# Patient Record
Sex: Female | Born: 1990 | Race: White | Hispanic: No | Marital: Single | Smoking: Never smoker
Health system: Southern US, Community
[De-identification: ages and names within clinical notes are randomized; demographics above are authoritative.]

## PROBLEM LIST (undated history)

## (undated) ENCOUNTER — Encounter

## (undated) ENCOUNTER — Encounter: Attending: Medical | Primary: Medical

## (undated) ENCOUNTER — Ambulatory Visit: Attending: Pharmacist | Primary: Pharmacist

## (undated) ENCOUNTER — Ambulatory Visit

## (undated) ENCOUNTER — Encounter: Attending: Pharmacist | Primary: Pharmacist

## (undated) ENCOUNTER — Ambulatory Visit: Attending: Medical | Primary: Medical

## (undated) ENCOUNTER — Other Ambulatory Visit: Attending: Registered" | Primary: Registered"

## (undated) ENCOUNTER — Encounter
Attending: Student in an Organized Health Care Education/Training Program | Primary: Student in an Organized Health Care Education/Training Program

## (undated) ENCOUNTER — Ambulatory Visit: Payer: PRIVATE HEALTH INSURANCE

## (undated) ENCOUNTER — Other Ambulatory Visit: Attending: Medical | Primary: Medical

## (undated) ENCOUNTER — Ambulatory Visit: Payer: PRIVATE HEALTH INSURANCE | Attending: Medical | Primary: Medical

## (undated) ENCOUNTER — Telehealth

## (undated) ENCOUNTER — Other Ambulatory Visit

## (undated) ENCOUNTER — Telehealth: Attending: Medical | Primary: Medical

## (undated) DIAGNOSIS — K859 Acute pancreatitis without necrosis or infection, unspecified: Secondary | ICD-10-CM

## (undated) DIAGNOSIS — J45909 Unspecified asthma, uncomplicated: Secondary | ICD-10-CM

## (undated) HISTORY — DX: Acute pancreatitis without necrosis or infection, unspecified: K85.90

## (undated) MED ORDER — CITRUCEL (SUCROSE) ORAL POWDER: 0.00000 days

---

## 2020-02-21 ENCOUNTER — Ambulatory Visit: Payer: Self-pay

## 2020-07-29 ENCOUNTER — Emergency Department (HOSPITAL_COMMUNITY): Payer: BC Managed Care – PPO

## 2020-07-29 ENCOUNTER — Inpatient Hospital Stay (HOSPITAL_COMMUNITY)
Admission: EM | Admit: 2020-07-29 | Discharge: 2020-07-31 | DRG: 440 | Disposition: A | Discharge status: Home / Self Care | Payer: BC Managed Care – PPO | Attending: Internal Medicine | Admitting: Internal Medicine

## 2020-07-29 ENCOUNTER — Encounter (HOSPITAL_COMMUNITY): Payer: Self-pay | Admitting: Emergency Medicine

## 2020-07-29 DIAGNOSIS — K859 Acute pancreatitis without necrosis or infection, unspecified: Secondary | ICD-10-CM | POA: Diagnosis present

## 2020-07-29 DIAGNOSIS — Z20822 Contact with and (suspected) exposure to covid-19: Secondary | ICD-10-CM | POA: Diagnosis present

## 2020-07-29 DIAGNOSIS — K858 Other acute pancreatitis without necrosis or infection: Secondary | ICD-10-CM | POA: Diagnosis present

## 2020-07-29 DIAGNOSIS — R109 Unspecified abdominal pain: Secondary | ICD-10-CM

## 2020-07-29 DIAGNOSIS — R1013 Epigastric pain: Secondary | ICD-10-CM

## 2020-07-29 LAB — COMPREHENSIVE METABOLIC PANEL
ALT: 22 U/L (ref 0–44)
AST: 36 U/L (ref 15–41)
Albumin: 4.5 g/dL (ref 3.5–5.0)
Alkaline Phosphatase: 47 U/L (ref 38–126)
Anion gap: 11 (ref 5–15)
BUN: 12 mg/dL (ref 6–20)
CO2: 23 mmol/L (ref 22–32)
Calcium: 9.5 mg/dL (ref 8.9–10.3)
Chloride: 99 mmol/L (ref 98–111)
Creatinine, Ser: 0.65 mg/dL (ref 0.44–1.00)
GFR calc Af Amer: >60 mL/min (ref >60)
GFR calc non Af Amer: >60 mL/min (ref >60)
Glucose, Bld: 123 mg/dL — ABNORMAL HIGH (ref 70–99)
Potassium: 5 mmol/L (ref 3.5–5.1)
Sodium: 133 mmol/L — ABNORMAL LOW (ref 135–145)
Total Bilirubin: 0.9 mg/dL (ref 0.3–1.2)
Total Protein: 8.4 g/dL — ABNORMAL HIGH (ref 6.5–8.1)

## 2020-07-29 LAB — URINALYSIS, ROUTINE W REFLEX MICROSCOPIC
Bilirubin Urine: NEGATIVE
Glucose, UA: NEGATIVE mg/dL
Hgb urine dipstick: NEGATIVE
Ketones, ur: 20 mg/dL — AB
Nitrite: NEGATIVE
Protein, ur: NEGATIVE mg/dL
Specific Gravity, Urine: 1.024 (ref 1.005–1.030)
pH: 5 (ref 5.0–8.0)

## 2020-07-29 LAB — CBC
HCT: 44 % (ref 36.0–46.0)
Hemoglobin: 14.4 g/dL (ref 12.0–15.0)
MCH: 29.9 pg (ref 26.0–34.0)
MCHC: 32.7 g/dL (ref 30.0–36.0)
MCV: 91.3 fL (ref 80.0–100.0)
Platelets: 337 10*3/uL (ref 150–400)
RBC: 4.82 MIL/uL (ref 3.87–5.11)
RDW: 12 % (ref 11.5–15.5)
WBC: 11.2 10*3/uL — ABNORMAL HIGH (ref 4.0–10.5)
nRBC: 0 % (ref 0.0–0.2)

## 2020-07-29 LAB — HCG, QUANTITATIVE, PREGNANCY: hCG, Beta Chain, Quant, S: <1 m[IU]/mL (ref <5)

## 2020-07-29 LAB — SARS CORONAVIRUS 2 BY RT PCR (HOSPITAL ORDER, PERFORMED IN ~~LOC~~ HOSPITAL LAB): SARS Coronavirus 2: NEGATIVE

## 2020-07-29 LAB — LIPASE, BLOOD: Lipase: 1958 U/L — ABNORMAL HIGH (ref 11–51)

## 2020-07-29 MED ORDER — ONDANSETRON HCL 4 MG/2ML IJ SOLN
4.0000 mg | Freq: Once | INTRAMUSCULAR | Status: AC
  Administered 2020-07-29: 4 mg via INTRAVENOUS
  Filled 2020-07-29: qty 2

## 2020-07-29 MED ORDER — IOHEXOL 300 MG/ML  SOLN
100.0000 mL | Freq: Once | INTRAMUSCULAR | Status: AC | PRN
  Administered 2020-07-29: 100 mL via INTRAVENOUS

## 2020-07-29 MED ORDER — FENTANYL CITRATE (PF) 100 MCG/2ML IJ SOLN
50.0000 ug | Freq: Once | INTRAMUSCULAR | Status: AC
  Administered 2020-07-29: 50 ug via INTRAVENOUS
  Filled 2020-07-29: qty 2

## 2020-07-29 MED ORDER — SODIUM CHLORIDE 0.9 % IV BOLUS
1000.0000 mL | Freq: Once | INTRAVENOUS | Status: AC
  Administered 2020-07-29: 1000 mL via INTRAVENOUS

## 2020-07-29 MED ORDER — SODIUM CHLORIDE (PF) 0.9 % IJ SOLN
INTRAMUSCULAR | Status: AC
  Administered 2020-07-29: 10 mL
  Filled 2020-07-29: qty 50

## 2020-07-29 MED ORDER — ONDANSETRON 4 MG PO TBDP
4.0000 mg | ORAL_TABLET | Freq: Once | ORAL | Status: AC | PRN
  Administered 2020-07-29: 4 mg via ORAL
  Filled 2020-07-29: qty 1

## 2020-07-29 MED ORDER — MORPHINE SULFATE (PF) 4 MG/ML IV SOLN
4.0000 mg | Freq: Once | INTRAVENOUS | Status: AC
  Administered 2020-07-29: 4 mg via INTRAVENOUS
  Filled 2020-07-29: qty 1

## 2020-07-29 MED ORDER — SODIUM CHLORIDE 0.9 % IV SOLN: Freq: Once | INTRAVENOUS | Status: AC

## 2020-07-29 NOTE — ED Provider Notes (Signed)
[facility]-EMERGENCY DEPT Provider Note   CSN: [id number] Arrival date & time: 07/29/20  1249     History Chief Complaint  Patient presents with  . Abdominal Pain  . Emesis    Angela Montes is a 29 y.o. female who presents for evaluation of nausea/vomiting and abdominal pain that began yesterday morning.  She reports that yesterday morning, she ate a big cinnamon roll.  She reports shortly after, she started having upper and middle abdominal pain.  She states that then she started having nausea/vomiting.  She initially attributed to eating a large amount of bread as she states she had had similar pain after eating a bagel previously.  She states that the pain persisted.  She has still had nausea/vomiting is not been able to tolerate any p.o.  She states that she has not taken anything for the pain.  She has not had any fever.  She denies any chest pain, difficulty breathing, dysuria, hematuria, diarrhea.  No prior history of abdominal surgeries.  Patient reports that she does not smoke.  She reports she socially drinks alcohol but reports very infrequently.  She just did return from a month-long camping trip.  She does not recall being bit by anything.  She recently started benzonatate but no other medications.   The history is provided by the patient.       History reviewed. No pertinent past medical history.  There are no problems to display for this patient.   History reviewed. No pertinent surgical history.   OB History   No obstetric history on file.     No family history on file.  Social History   Tobacco Use  . Smoking status: Not on file  Substance Use Topics  . Alcohol use: Not on file  . Drug use: Not on file    Home Medications Prior to Admission medications   Medication Sig Start Date End Date Taking? Authorizing Provider  albuterol (VENTOLIN HFA) 108 (90 Base) MCG/ACT inhaler Inhale 2 puffs into the lungs every 6 (six) hours as  needed for wheezing or shortness of breath.  07/26/20  Yes [provider]  benzonatate (TESSALON) 100 MG capsule Take 100 mg by mouth 3 (three) times daily. 07/26/20  Yes [provider]    Allergies    Patient has no known allergies.  Review of Systems   Review of Systems  Constitutional: Negative for fever.  Respiratory: Negative for cough and shortness of breath.   Cardiovascular: Negative for chest pain.  Gastrointestinal: Positive for abdominal pain, nausea and vomiting. Negative for diarrhea.  Genitourinary: Negative for dysuria and hematuria.  Neurological: Negative for headaches.  All other systems reviewed and are negative.   Physical Exam Updated Vital Signs BP 118/79   Pulse 61   Temp (!) 97.5 F (36.4 C)   Resp 18   Ht 5\' 6"  (1.676 m)   Wt 67.1 kg   LMP 07/15/2020 (Approximate)   SpO2 100%   BMI 23.89 kg/m   Physical Exam Vitals and nursing note reviewed.  Constitutional:      Appearance: Normal appearance. She is well-developed.  HENT:     Head: Normocephalic and atraumatic.  Eyes:     General: Lids are normal.     Conjunctiva/sclera: Conjunctivae normal.     Pupils: Pupils are equal, round, and reactive to light.  Cardiovascular:     Rate and Rhythm: Normal rate and regular rhythm.     Pulses: Normal pulses.  Heart sounds: Normal heart sounds. No murmur heard.  No friction rub. No gallop.   Pulmonary:     Effort: Pulmonary effort is normal.     Breath sounds: Normal breath sounds.     Comments: Lungs clear to auscultation bilaterally.  Symmetric chest rise.  No wheezing, rales, rhonchi. Abdominal:     Palpations: Abdomen is soft. Abdomen is not rigid.     Tenderness: There is abdominal tenderness in the right upper quadrant, epigastric area and periumbilical area. There is no right CVA tenderness, left CVA tenderness or guarding.     Comments: Tenderness palpation noted to the right upper quadrant, periumbilical, epigastric region.   No rigidity, guarding.  Positive Murphy sign.  No CVA tenderness noted bilaterally.  Musculoskeletal:        General: Normal range of motion.     Cervical back: Full passive range of motion without pain.  Skin:    General: Skin is warm and dry.     Capillary Refill: Capillary refill takes less than 2 seconds.  Neurological:     Mental Status: She is alert and oriented to person, place, and time.  Psychiatric:        Speech: Speech normal.     ED Results / Procedures / Treatments   Labs (all labs ordered are listed, but only abnormal results are displayed) Labs Reviewed  LIPASE, BLOOD - Abnormal; Notable for the following components:      Result Value   Lipase 1,958 (*)    All other components within normal limits  COMPREHENSIVE METABOLIC PANEL - Abnormal; Notable for the following components:   Sodium 133 (*)    Glucose, Bld 123 (*)    Total Protein 8.4 (*)    All other components within normal limits  URINALYSIS, ROUTINE W REFLEX MICROSCOPIC - Abnormal; Notable for the following components:   Ketones, ur 20 (*)    Leukocytes,Ua TRACE (*)    Bacteria, UA FEW (*)    All other components within normal limits  CBC - Abnormal; Notable for the following components:   WBC 11.2 (*)    All other components within normal limits  SARS CORONAVIRUS 2 BY RT PCR (HOSPITAL ORDER, PERFORMED IN [facility])  HCG, QUANTITATIVE, PREGNANCY  TRIGLYCERIDES    EKG None  Radiology CT ABDOMEN PELVIS W CONTRAST  Result Date: 07/29/2020 CLINICAL DATA:  Generalized abdominal pain with nausea and vomiting. EXAM: CT ABDOMEN AND PELVIS WITH CONTRAST TECHNIQUE: Multidetector CT imaging of the abdomen and pelvis was performed using the standard protocol following bolus administration of intravenous contrast. CONTRAST:  OMNIPAQUE IOHEXOL 300 MG/ML  SOLN COMPARISON:  Ultrasound 07/29/2020 FINDINGS: Lower chest: The lung bases are clear. Hepatobiliary: No focal liver abnormality is seen.  No gallstones, gallbladder wall thickening, or biliary dilatation. Pancreas: Diffuse enlargement of the pancreas. Peripancreatic edema. Mild stranding around the pancreas. Changes consistent with acute pancreatitis. No localized fluid collections and no ductal dilatation. Spleen: Normal in size without focal abnormality. Adrenals/Urinary Tract: Adrenal glands are unremarkable. Kidneys are normal, without renal calculi, focal lesion, or hydronephrosis. Bladder is unremarkable. Stomach/Bowel: Stomach is within normal limits. Appendix appears normal. No evidence of bowel wall thickening, distention, or inflammatory changes. Vascular/Lymphatic: No significant vascular findings are present. No enlarged abdominal or pelvic lymph nodes. Reproductive: Uterus and bilateral adnexa are unremarkable. Other: Small amount of free fluid in the pelvis. This could be physiologic or reactive related to the pancreatitis. Musculoskeletal: No acute or significant osseous findings. IMPRESSION:  1. Changes of acute interstitial edematous pancreatitis. No localized fluid collections and no ductal dilatation. 2. Small amount of free fluid in the pelvis could be physiologic or reactive related to the pancreatitis. Electronically Signed   By: Burman Nieves M.D.   On: 07/29/2020 22:15   US Abdomen Limited RUQ  Result Date: 07/29/2020 CLINICAL DATA:  Abdomen pain EXAM: ULTRASOUND ABDOMEN LIMITED RIGHT UPPER QUADRANT COMPARISON:  None. FINDINGS: Gallbladder: No gallstones or wall thickening visualized. No sonographic Murphy sign noted by sonographer. Common bile duct: Diameter: 3 mm Liver: No focal lesion identified. Within normal limits in parenchymal echogenicity. Portal vein is patent on color Doppler imaging with normal direction of blood flow towards the liver. Other: The pancreas appears enlarged. IMPRESSION: 1. Negative for gallstones. 2. Pancreas appears enlarged, raising possibility of pancreatitis. Correlation with CT may be  obtained. Electronically Signed   By: Jasmine Pang M.D.   On: 07/29/2020 21:37    Procedures Procedures (including critical care time)  Medications Ordered in ED Medications  ondansetron (ZOFRAN-ODT) disintegrating tablet 4 mg (4 mg Oral Given 07/29/20 1650)  sodium chloride 0.9 % bolus 1,000 mL (0 mLs Intravenous Stopped 07/29/20 2215)  morphine 4 MG/ML injection 4 mg (4 mg Intravenous Given 07/29/20 2048)  ondansetron (ZOFRAN) injection 4 mg (4 mg Intravenous Given 07/29/20 2048)  sodium chloride (PF) 0.9 % injection (10 mLs  Given 07/29/20 2054)  iohexol (OMNIPAQUE) 300 MG/ML solution 100 mL (100 mLs Intravenous Contrast Given 07/29/20 2155)  fentaNYL (SUBLIMAZE) injection 50 mcg (50 mcg Intravenous Given 07/29/20 2205)  ondansetron (ZOFRAN) injection 4 mg (4 mg Intravenous Given 07/29/20 2204)  0.9 %  sodium chloride infusion ( Intravenous New Bag/Given 07/29/20 2217)    ED Course  I have reviewed the triage vital signs and the nursing notes.  Pertinent labs & imaging results that were available during my care of the patient were reviewed by me and considered in my medical decision making (see chart for details).  Clinical Course as of Jul 30 2255  Thu Jul 29, 2020  2159 Lipase(!): 1,958 [AS]  2200 Lipase(!): 1,958 [AS]    Clinical Course User Index [AS] Ardith Dark   MDM Rules/Calculators/A&P                          29 year old who presents for evaluation abdominal pain, nausea/vomiting.  No fevers.  No recent alcohol use.  Recently on Tessalon Perles for cough.  No other medications.  No frequent alcohol use.  On initial arrival, she is afebrile, nontoxic-appearing.  Vital signs are stable.  On exam, she has tenderness palpation noted to the mid, right upper quadrant abdomen.  No CVA tenderness.  Concern for infectious etiology versus hepatobiliary etiology.  I-STAT beta negative.  CBC shows leukocytosis of 11.2.  Beta quant is negative.  UA shows trace leukocytes, pyuria.   CMP shows normal LFTs.  Lipase is significantly elevated at 1,958. Will obtain RUQ U/S. She does not have any risk factors for pancreatitis so question if this is gallstone induced pancreatitis.   Ultrasound negative for any gallstones.  We will plan for CT scan.  CT scan shows changes noted with acute interstitial edematous pancreatitis.  No "localized fluid collections.  No ductal dilatation.  Discussed results with patient.  Given patient's high lipase level and concern for pancreatitis, will plan to admit her for pain control, fluids.  Updated patient on plan she is agreeable. Discussed patient with Dr. Anitra Lauth  who is agreeable.   Discussed patient with Dr. Vennie Homans (GI) who will accept patient for admission. Requests that triglycerides be sent.   Updated patient on plan and she is agreeable.   Portions of this note were generated with Scientist, clinical (histocompatibility and immunogenetics). Dictation errors may occur despite best attempts at proofreading.   Final Clinical Impression(s) / ED Diagnoses Final diagnoses:  Abdominal pain  Acute pancreatitis, unspecified complication status, unspecified pancreatitis type    Rx / DC Orders ED Discharge Orders    None       Rosana Hoes 07/29/20 2256    Gwyneth Sprout, MD 07/30/20 1552

## 2020-07-29 NOTE — ED Triage Notes (Signed)
Patient c/o abdominal pain since waking this morning with N/V. Denies diarrhea.

## 2020-07-29 NOTE — H&P (Signed)
[facility] History and Physical  Angela Montes WIO:[id number] DOB: 08/16/1991 DOA: 07/29/2020  Referring physician: Graciella Freer, PA-C PCP: Patient, No Pcp Per   Chief Complaint: abdominal pain  HPI: Angela Montes is a 29 y.o. female past medical history notable only for asthma who presents with a 1 day history of abdominal pain.  Patient reports she had some bread this morning and afterwards developed abdominal pain in the epigastric area. She has had similar symptoms before when eating bread but this pain did not go away and in fact got worse. Over the course the day she also developed nausea and vomiting, it was nonbloody and nonbilious. She denies any fevers, she reports a BM earlier today that was nonbloody.  Patient endorses occasional and very small amounts of alcohol intake but none recently. For the past several weeks she was on a cross-country road trip with a school group that was unremarkable, she did develop a hoarse voice and recently saw her PCP who prescribed Tessalon Perles for her. Otherwise she denies any other new medications. Did not believe she received any other bug bites besides mosquitoes while on the trip.  Mother present at bedside, she does not know of any problems with cholesterol or triglycerides in the family.  In the ED vital signs of been unremarkable, laboratory work-up notable for unremarkable CBC, unremarkable CMP, and lipase of 1958. She underwent abdominal ultrasound which showed findings concerning for pancreatitis but no evidence of gallstones, and subsequently a CT abdomen pelvis which also showed acute pancreatitis. She was given 1 L normal saline, IV pain meds and antiemetics, and admitted for further management.    Review of Systems:   History reviewed. No pertinent past medical history. History reviewed. No pertinent surgical history. Social History:  has no history on file for tobacco use, alcohol use, and drug use.  No  Known Allergies  No family history on file.   Prior to Admission medications   Medication Sig Start Date End Date Taking? Authorizing Provider  albuterol (VENTOLIN HFA) 108 (90 Base) MCG/ACT inhaler Inhale 2 puffs into the lungs every 6 (six) hours as needed for wheezing or shortness of breath.  07/26/20  Yes [provider]  benzonatate (TESSALON) 100 MG capsule Take 100 mg by mouth 3 (three) times daily. 07/26/20  Yes [provider]   Physical Exam: Vitals:   07/29/20 2100 07/29/20 2131 07/29/20 2210 07/29/20 2247  BP: 120/77 120/76 124/81 118/79  Pulse: (!) 57 (!) 49 66 61  Resp: 18 18 18 18   Temp:      TempSrc:      SpO2: 100% 100% 100% 100%  Weight:      Height:        Wt Readings from Last 3 Encounters:  07/29/20 67.1 kg    General:  Appears calm and comfortable Eyes: PERRL, normal lids, irises & conjunctiva. ?very small xanthelasmas? bilaterally ENT: grossly normal hearing, lips & tongue Neck: no LAD, masses or thyromegaly Cardiovascular: RRR, no m/r/g. No LE edema. Respiratory: CTA bilaterally, no w/r/r. Normal respiratory effort. Abdomen: soft, mild epigastric tenderness to palpation Musculoskeletal: grossly normal tone BUE/BLE Psychiatric: grossly normal mood and affect, speech fluent and appropriate Neurologic: grossly non-focal.          Labs on Admission:  Basic Metabolic Panel: Recent Labs  Lab 07/29/20 1441  NA 133*  K 5.0  CL 99  CO2 23  GLUCOSE 123*  BUN 12  CREATININE 0.65  CALCIUM 9.5  Liver Function Tests: Recent Labs  Lab 07/29/20 1441  AST 36  ALT 22  ALKPHOS 47  BILITOT 0.9  PROT 8.4*  ALBUMIN 4.5   Recent Labs  Lab 07/29/20 1441  LIPASE 1,958*   No results for input(s): AMMONIA in the last 168 hours. CBC: Recent Labs  Lab 07/29/20 1657  WBC 11.2*  HGB 14.4  HCT 44.0  MCV 91.3  PLT 337   Cardiac Enzymes: No results for input(s): CKTOTAL, CKMB, CKMBINDEX, TROPONINI in the last 168 hours.  BNP  (last 3 results) No results for input(s): BNP in the last 8760 hours.  ProBNP (last 3 results) No results for input(s): PROBNP in the last 8760 hours.  CBG: No results for input(s): GLUCAP in the last 168 hours.  Radiological Exams on Admission: CT ABDOMEN PELVIS W CONTRAST  Result Date: 07/29/2020 CLINICAL DATA:  Generalized abdominal pain with nausea and vomiting. EXAM: CT ABDOMEN AND PELVIS WITH CONTRAST TECHNIQUE: Multidetector CT imaging of the abdomen and pelvis was performed using the standard protocol following bolus administration of intravenous contrast. CONTRAST:  OMNIPAQUE IOHEXOL 300 MG/ML  SOLN COMPARISON:  Ultrasound 07/29/2020 FINDINGS: Lower chest: The lung bases are clear. Hepatobiliary: No focal liver abnormality is seen. No gallstones, gallbladder wall thickening, or biliary dilatation. Pancreas: Diffuse enlargement of the pancreas. Peripancreatic edema. Mild stranding around the pancreas. Changes consistent with acute pancreatitis. No localized fluid collections and no ductal dilatation. Spleen: Normal in size without focal abnormality. Adrenals/Urinary Tract: Adrenal glands are unremarkable. Kidneys are normal, without renal calculi, focal lesion, or hydronephrosis. Bladder is unremarkable. Stomach/Bowel: Stomach is within normal limits. Appendix appears normal. No evidence of bowel wall thickening, distention, or inflammatory changes. Vascular/Lymphatic: No significant vascular findings are present. No enlarged abdominal or pelvic lymph nodes. Reproductive: Uterus and bilateral adnexa are unremarkable. Other: Small amount of free fluid in the pelvis. This could be physiologic or reactive related to the pancreatitis. Musculoskeletal: No acute or significant osseous findings. IMPRESSION: 1. Changes of acute interstitial edematous pancreatitis. No localized fluid collections and no ductal dilatation. 2. Small amount of free fluid in the pelvis could be physiologic or reactive  related to the pancreatitis. Electronically Signed   By: Burman Nieves M.D.   On: 07/29/2020 22:15   US Abdomen Limited RUQ  Result Date: 07/29/2020 CLINICAL DATA:  Abdomen pain EXAM: ULTRASOUND ABDOMEN LIMITED RIGHT UPPER QUADRANT COMPARISON:  None. FINDINGS: Gallbladder: No gallstones or wall thickening visualized. No sonographic Murphy sign noted by sonographer. Common bile duct: Diameter: 3 mm Liver: No focal lesion identified. Within normal limits in parenchymal echogenicity. Portal vein is patent on color Doppler imaging with normal direction of blood flow towards the liver. Other: The pancreas appears enlarged. IMPRESSION: 1. Negative for gallstones. 2. Pancreas appears enlarged, raising possibility of pancreatitis. Correlation with CT may be obtained. Electronically Signed   By: Jasmine Pang M.D.   On: 07/29/2020 21:37    EKG: Not obtained  Assessment/Plan Active Problems:   Acute pancreatitis  #Acute pancreatitis Patient presenting with laboratory and imaging findings consistent with acute pancreatitis, at present of unclear etiology. No evidence of gallstones, patient denies excess alcohol intake, calcium levels are normal, and no new medications are likely culprits. On exam some question of xanthelasma around the eyes, and given lack of other precipitating factors suspect hypertriglyceridemia may be contributing, labs currently pending. If does return with hypertriglyceridemia will need to be transferred to stepdown unit for insulin drip, for now will admit to MedSurg. Discussed diagnosis,  work-up, and treatment at length with patient and her mother at bedside. -LR at 150 cc an hour -N.p.o., advance diet as tolerated -IV pain meds and antiemetics as needed -Follow-up triglycerides   Code Status: Full Code, unconfirmed DVT Prophylaxis: early ambulation Family Communication: mother at bedside during admission Disposition Plan: Inpatient  Time spent: 13 min  Venora Maples  MD/MPH [facility]

## 2020-07-30 ENCOUNTER — Encounter (HOSPITAL_COMMUNITY): Payer: Self-pay | Admitting: Family Medicine

## 2020-07-30 ENCOUNTER — Other Ambulatory Visit: Payer: Self-pay

## 2020-07-30 DIAGNOSIS — K859 Acute pancreatitis without necrosis or infection, unspecified: Secondary | ICD-10-CM

## 2020-07-30 LAB — CBC
HCT: 41.7 % (ref 36.0–46.0)
Hemoglobin: 13.9 g/dL (ref 12.0–15.0)
MCH: 30.2 pg (ref 26.0–34.0)
MCHC: 33.3 g/dL (ref 30.0–36.0)
MCV: 90.5 fL (ref 80.0–100.0)
Platelets: 268 10*3/uL (ref 150–400)
RBC: 4.61 MIL/uL (ref 3.87–5.11)
RDW: 12.1 % (ref 11.5–15.5)
WBC: 10.4 10*3/uL (ref 4.0–10.5)
nRBC: 0 % (ref 0.0–0.2)

## 2020-07-30 LAB — TRIGLYCERIDES: Triglycerides: 40 mg/dL (ref <150)

## 2020-07-30 LAB — COMPREHENSIVE METABOLIC PANEL
ALT: 23 U/L (ref 0–44)
AST: 23 U/L (ref 15–41)
Albumin: 3.9 g/dL (ref 3.5–5.0)
Alkaline Phosphatase: 42 U/L (ref 38–126)
Anion gap: 11 (ref 5–15)
BUN: 11 mg/dL (ref 6–20)
CO2: 22 mmol/L (ref 22–32)
Calcium: 8.6 mg/dL — ABNORMAL LOW (ref 8.9–10.3)
Chloride: 102 mmol/L (ref 98–111)
Creatinine, Ser: 0.57 mg/dL (ref 0.44–1.00)
GFR calc Af Amer: >60 mL/min (ref >60)
GFR calc non Af Amer: >60 mL/min (ref >60)
Glucose, Bld: 112 mg/dL — ABNORMAL HIGH (ref 70–99)
Potassium: 4.2 mmol/L (ref 3.5–5.1)
Sodium: 135 mmol/L (ref 135–145)
Total Bilirubin: 0.7 mg/dL (ref 0.3–1.2)
Total Protein: 7 g/dL (ref 6.5–8.1)

## 2020-07-30 LAB — HIV ANTIBODY (ROUTINE TESTING W REFLEX): HIV Screen 4th Generation wRfx: NONREACTIVE

## 2020-07-30 MED ORDER — ALBUTEROL SULFATE HFA 108 (90 BASE) MCG/ACT IN AERS: 2.0000 | INHALATION_SPRAY | Freq: Four times a day (QID) | RESPIRATORY_TRACT | Status: DC | PRN

## 2020-07-30 MED ORDER — ONDANSETRON HCL 4 MG PO TABS: 4.0000 mg | ORAL_TABLET | Freq: Four times a day (QID) | ORAL | Status: DC | PRN

## 2020-07-30 MED ORDER — SODIUM CHLORIDE 0.9% FLUSH
3.0000 mL | Freq: Two times a day (BID) | INTRAVENOUS | Status: DC
  Administered 2020-07-30 (×2): 3 mL via INTRAVENOUS

## 2020-07-30 MED ORDER — HYDROMORPHONE HCL 1 MG/ML IJ SOLN
0.5000 mg | INTRAMUSCULAR | Status: DC | PRN
  Administered 2020-07-30: 13:00:00 1 mg via INTRAVENOUS
  Administered 2020-07-30: 0.5 mg via INTRAVENOUS
  Administered 2020-07-30 – 2020-07-31 (×6): 1 mg via INTRAVENOUS
  Filled 2020-07-30 (×9): qty 1

## 2020-07-30 MED ORDER — ALBUTEROL SULFATE (2.5 MG/3ML) 0.083% IN NEBU: 2.5000 mg | INHALATION_SOLUTION | Freq: Four times a day (QID) | RESPIRATORY_TRACT | Status: DC | PRN

## 2020-07-30 MED ORDER — POLYETHYLENE GLYCOL 3350 17 G PO PACK: 17.0000 g | PACK | Freq: Every day | ORAL | Status: DC | PRN

## 2020-07-30 MED ORDER — ONDANSETRON HCL 4 MG/2ML IJ SOLN: 4.0000 mg | Freq: Four times a day (QID) | INTRAMUSCULAR | Status: DC | PRN

## 2020-07-30 MED ORDER — LACTATED RINGERS IV SOLN: INTRAVENOUS | Status: DC

## 2020-07-30 NOTE — Consult Note (Addendum)
Referring Provider: Dr. Debbe Odea  Primary Care Physician:  Patient, No Pcp Per Primary Gastroenterologist:  Althia Forts   Reason for Consultation:  Acute pancreatitis   HPI: Angela Montes is a 29 y.o. female with a past medical history of asthma initially diagnosed at the age of 31.  No past surgical history.  She ate a cinnamon roll for dinner on Wed 07/28/2020. The next morning, she described feeling "off" but went to work. She developed nausea and central upper abdominal pain so she went home.  Her nausea and central abdominal pain worsened and she vomited clear water with mucous emesis x3 episodes.  No associated diarrhea.  She called her mother who advised her to go to the emergency room. She presented to [facility] emergency room on 07/29/2020 for further evaluation.  Labs in the ED showed a WBC 11.2.  Lipase level 1,958.  LFTs were normal. An abdominal ultrasound showed an enlarged pancreas without evidence of gallstones.  Abdominal/pelvic CT with contrast identified acute interstitial edematous pancreatitis without localized fluid collections or ductal dilatation.  Liver and gallbladder were normal. He nausea and abdominal pain were controlled after receiving Zofran and Morphine. She received NS 1L IV. A GI consult was requested for further evaluation.   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 She returned to [facility] on 07/25/2020. During her time of travel she developed a productive cough consisting of yellow mucous. No fever. No hemoptysis. She took Amoxicillin 575m one tab po QD x 3 days which she received from a friend who attended the camping trip. Her cough did not improve. Her dietary and sleeping habits were altered during her time of travel, ate more sandwiches and she slept less. No N/V or abdominal pain during her time of travel. She drank one margarita on Sunday 8/1 and a few days prior to that she drank 1/2 seltzer x 2. No  history of alcohol abuse or binge alcohol drinking. No drug use. No history of hypertriglyceridemia. She reported having a somewhat similar episode of central upper abdominal pain without N/V about 4 months ago which occurred after eating a bagel. She is passing a normal brown formed BM daily. No diarrhea. No oily stools or floating stools. No weight loss. No family history of pancreatitis. Her paternal uncle died at the age of 2 due to cystic fibrosis. She continue to have a productive cough. She contacted her PCP and she was prescribed an albuterol inhaler and Tessalon Perles. She took 1 Tessalon Perle on 8/ 3, 3 perles on 8/4 and one on 8/5. No other new medications. No other complaints today. Her mother is present.    ED course: Sodium 133.  Potassium 5.0.  Chloride 99.  CO2 23.  Glucose 123.  BUN 12.  Creatinine 0.65.  Alk phos 47.  Albumin 4.5.  Lipase 1958.  AST 36.  ALT 22.  Total bili 0.9.  WBC 11.2.  Hemoglobin 14.4.  Hematocrit 44.0.  MCV 91.3.  Platelet 337.  Beta hCG negative.  Urine ketones 20.  Urine leukocytes trace.  SARS coronavirus 2 negative.   Labs 07/30/2020: AST 23.  ALT 23.  Triglyceride 40.  WBC 10.4.  Hemoglobin 13.9.  Abdominal ultrasound 07/29/2020: 1. Negative for gallstones. 2. Pancreas appears enlarged, raising possibility of pancreatitis. Correlation with CT may be obtained.  Abdominal/pelvic CT with contrast 07/29/2020: 1. Changes of acute interstitial edematous pancreatitis. No localized fluid collections and no ductal dilatation. 2.  Small amount of free fluid in the pelvis could be physiologic or reactive related to the pancreatitis.    Prior to Admission medications   Medication Sig Start Date End Date Taking? Authorizing Provider  albuterol (VENTOLIN HFA) 108 (90 Base) MCG/ACT inhaler Inhale 2 puffs into the lungs every 6 (six) hours as needed for wheezing or shortness of breath.  07/26/20  Yes [provider]  benzonatate (TESSALON) 100 MG capsule Take  100 mg by mouth 3 (three) times daily. 07/26/20  Yes [provider]    Current Facility-Administered Medications  Medication Dose Route Frequency Provider Last Rate Last Admin  . albuterol (PROVENTIL) (2.5 MG/3ML) 0.083% nebulizer solution 2.5 mg  2.5 mg Nebulization Q6H PRN Clarnce Flock, MD      . HYDROmorphone (DILAUDID) injection 0.5-1 mg  0.5-1 mg Intravenous Q2H PRN Clarnce Flock, MD   1 mg at 07/30/20 0241  . lactated ringers infusion   Intravenous Continuous Clarnce Flock, MD 150 mL/hr at 07/30/20 0733 New Bag at 07/30/20 0733  . ondansetron (ZOFRAN) tablet 4 mg  4 mg Oral Q6H PRN Clarnce Flock, MD       Or  . ondansetron [facility]) injection 4 mg  4 mg Intravenous Q6H PRN Clarnce Flock, MD      . polyethylene glycol (MIRALAX / GLYCOLAX) packet 17 g  17 g Oral Daily PRN Clarnce Flock, MD      . sodium chloride flush (NS) 0.9 % injection 3 mL  3 mL Intravenous Q12H Clarnce Flock, MD   3 mL at 07/30/20 0021    Allergies as of 07/29/2020  . (No Known Allergies)    History reviewed. No pertinent family history.  Social History   Socioeconomic History  . Marital status: Unknown    Spouse name: Not on file  . Number of children: Not on file  . Years of education: Not on file  . Highest education level: Not on file  Occupational History  . Not on file  Tobacco Use  . Smoking status: Never Smoker  . Smokeless tobacco: Never Used  Substance and Sexual Activity  . Alcohol use: Not on file  . Drug use: Not on file  . Sexual activity: Not on file  Other Topics Concern  . Not on file  Social History Narrative  . Not on file   Social Determinants of Health   Financial Resource Strain:   . Difficulty of Paying Living Expenses:   Food Insecurity:   . Worried About Charity fundraiser in the Last Year:   . Arboriculturist in the Last Year:   Transportation Needs:   . Film/video editor (Medical):   Marland Kitchen Lack of Transportation  (Non-Medical):   Physical Activity:   . Days of Exercise per Week:   . Minutes of Exercise per Session:   Stress:   . Feeling of Stress :   Social Connections:   . Frequency of Communication with Friends and Family:   . Frequency of Social Gatherings with Friends and Family:   . Attends Religious Services:   . Active Member of Clubs or Organizations:   . Attends Archivist Meetings:   Marland Kitchen Marital Status:   Intimate Partner Violence:   . Fear of Current or Ex-Partner:   . Emotionally Abused:   Marland Kitchen Physically Abused:   . Sexually Abused:     Review of Systems: Gen: Denies fever, sweats or chills. No weight loss.  CV: Denies chest pain, palpitations or edema. Resp: Denies cough, shortness of breath of hemoptysis.  GI: See HPI.  GU : Denies urinary burning, blood in urine, increased urinary frequency or incontinence. MS: Denies joint pain, muscles aches or weakness. Derm: Denies rash, itchiness, skin lesions or unhealing ulcers. Psych: Denies depression, anxiety or memory loss.  Heme: Denies easy bruising, bleeding. Neuro:  Denies headaches, dizziness or paresthesias. Endo:  Denies any problems with DM, thyroid or adrenal function.  Physical Exam: Vital signs in last 24 hours: Temp:  [97.5 F (36.4 C)-98.1 F (36.7 C)] 98 F (36.7 C) (08/06 0513) Pulse Rate:  [49-77] 57 (08/06 0513) Resp:  [16-20] 16 (08/06 0513) BP: (106-127)/(70-94) 106/70 (08/06 0513) SpO2:  [98 %-100 %] 100 % (08/06 0513) Weight:  [67.1 kg-68 kg] 67.1 kg (08/05 1722) Last BM Date: 07/27/20 General:  Alert,  well-developed, well-nourished, pleasant and cooperative in NAD. Head:  Normocephalic and atraumatic. Eyes:  No scleral icterus. Conjunctiva pink. Ears:  Normal auditory acuity. Nose:  No deformity, discharge or lesions. Mouth:  Dentition intact. No ulcers or lesions.  Neck:  Supple. No lymphadenopathy or thyromegaly.  Lungs: Breath sounds clear throughout.  Heart:  RRR, no murmur.    Abdomen: Soft, nondistended.  Mild epigastric and right upper quadrant tenderness.  No left upper quadrant tenderness.  No rebound or guarding.  Positive bowel sounds to all 4 quadrants.  No mass.  No HSM. Rectal: Deferred. Musculoskeletal:  Symmetrical without gross deformities.  Pulses:  Normal pulses noted. Extremities:  Without clubbing or edema. Neurologic:  Alert and  oriented x4. No focal deficits.  Skin:  Intact without significant lesions or rashes. Psych:  Alert and cooperative. Normal mood and affect.  Intake/Output from previous day: 08/05 0701 - 08/06 0700 In: 247.5 [I.V.:247.5] Out: 700 [Urine:700] Intake/Output this shift: Total I/O In: 827.5 [I.V.:827.5] Out: -   Lab Results: Recent Labs    07/29/20 1657 07/30/20 0214  WBC 11.2* 10.4  HGB 14.4 13.9  HCT 44.0 41.7  PLT 337 268   BMET Recent Labs    07/29/20 1441 07/30/20 0214  NA 133* 135  K 5.0 4.2  CL 99 102  CO2 23 22  GLUCOSE 123* 112*  BUN 12 11  CREATININE 0.65 0.57  CALCIUM 9.5 8.6*   LFT Recent Labs    07/30/20 0214  PROT 7.0  ALBUMIN 3.9  AST 23  ALT 23  ALKPHOS 42  BILITOT 0.7   PT/INR No results for input(s): LABPROT, INR in the last 72 hours. Hepatitis Panel No results for input(s): HEPBSAG, HCVAB, HEPAIGM, HEPBIGM in the last 72 hours.    Studies/Results: CT ABDOMEN PELVIS W CONTRAST  Result Date: 07/29/2020 CLINICAL DATA:  Generalized abdominal pain with nausea and vomiting. EXAM: CT ABDOMEN AND PELVIS WITH CONTRAST TECHNIQUE: Multidetector CT imaging of the abdomen and pelvis was performed using the standard protocol following bolus administration of intravenous contrast. CONTRAST:  144m OMNIPAQUE IOHEXOL 300 MG/ML  SOLN COMPARISON:  Ultrasound 07/29/2020 FINDINGS: Lower chest: The lung bases are clear. Hepatobiliary: No focal liver abnormality is seen. No gallstones, gallbladder wall thickening, or biliary dilatation. Pancreas: Diffuse enlargement of the pancreas.  Peripancreatic edema. Mild stranding around the pancreas. Changes consistent with acute pancreatitis. No localized fluid collections and no ductal dilatation. Spleen: Normal in size without focal abnormality. Adrenals/Urinary Tract: Adrenal glands are unremarkable. Kidneys are normal, without renal calculi, focal lesion, or hydronephrosis. Bladder is unremarkable. Stomach/Bowel: Stomach is within normal limits. Appendix appears normal.  No evidence of bowel wall thickening, distention, or inflammatory changes. Vascular/Lymphatic: No significant vascular findings are present. No enlarged abdominal or pelvic lymph nodes. Reproductive: Uterus and bilateral adnexa are unremarkable. Other: Small amount of free fluid in the pelvis. This could be physiologic or reactive related to the pancreatitis. Musculoskeletal: No acute or significant osseous findings. IMPRESSION: 1. Changes of acute interstitial edematous pancreatitis. No localized fluid collections and no ductal dilatation. 2. Small amount of free fluid in the pelvis could be physiologic or reactive related to the pancreatitis. Electronically Signed   By: Lucienne Capers M.D.   On: 07/29/2020 22:15   US Abdomen Limited RUQ  Result Date: 07/29/2020 CLINICAL DATA:  Abdomen pain EXAM: ULTRASOUND ABDOMEN LIMITED RIGHT UPPER QUADRANT COMPARISON:  None. FINDINGS: Gallbladder: No gallstones or wall thickening visualized. No sonographic Murphy sign noted by sonographer. Common bile duct: Diameter: 3 mm Liver: No focal lesion identified. Within normal limits in parenchymal echogenicity. Portal vein is patent on color Doppler imaging with normal direction of blood flow towards the liver. Other: The pancreas appears enlarged. IMPRESSION: 1. Negative for gallstones. 2. Pancreas appears enlarged, raising possibility of pancreatitis. Correlation with CT may be obtained. Electronically Signed   By: Donavan Foil M.D.   On: 07/29/2020 21:37    IMPRESSION/PLAN:  77.   29 year old female with nausea/vomiting and upper abdominal pain consistent with acute pancreatitis.  Admission lipase 1,958.  Normal LFTs.  Triglycerides 40.  Right upper quadrant ultrasound showed an enlarged pancreas. CTAP identified acute interstitial edematous pancreatitis. -Continue aggressive IV hydration.  LR at 150 cc an hour.  -IgG4 level, repeat Lipase level -Consider checking CFTR gene (+ family history of cystic fibrosis) -Consider abdominal MRI/MRCP as an outpatient to follow-up on pancreatitis, closer evaluation of the pancreatic ducts and to rule out pancreatic divisum -Okay to start clear liquid  -Pain management per the hospitalist -Zofran as needed -She will require a follow-up with Dr. Ardis Hughs in 6 to 8 weeks once discharged from the hospital   2.  History of asthma with current productive cough. Family history of CF.  -Management per the hospitalist and PCP     Noralyn Pick  07/30/2020, 8:52 AM  ________________________________________________________________________  Velora Heckler GI MD note:  I personally examined the patient, reviewed the data and agree with the assessment and plan described above.  Unclear etiology of her rather mild acute pancreatitis. We are checking for AIP by checking IgG 4 level. Also sending CFTR gene testing given FH of CF.  She has had viral URI symptoms and so perhaps that virus irritated her pancreas as well as her upper respiratory tract?  Ok to start clears, she knows she needs to ambulate, IV fluids, pain control.  Hopefully home in next 1-2 days.   Owens Loffler, MD [facility] Gastroenterology Pager [phone]

## 2020-07-30 NOTE — Progress Notes (Signed)
PROGRESS NOTE    Angela Montes   QQI:[id number]  DOB: August 11, 1991  DOA: 07/29/2020 PCP: Patient, No Pcp Per   Brief Narrative:  Angela Montes is a 29 y.o. female past medical history notable only for asthma who presents with a 1 day history of abdominal pain. She is found to have acute pancreatitis in the ED per imaging and blood work. She does not drink alcohol heavily.  Lipase 1,958 CT abd and Ultrasound abdomen both consistent with acute pancreatitis.   She admits to having a cough for a few weeks for which she took amoxicillin. She recently as prescribedTessalon Perles and an albuterol inhaler prescribed for this. Her maternal aunt has MS and her maternal uncle had transverse myelitis in his 30s and became paralyzed. Also a h/o cystic fibrosis in a paternal uncle.  No recent tick bites.   Subjective: Pain in central and right abdomen improving. Vomiting resolved. Would like to drink.     Assessment & Plan:   Active Problems:   Acute pancreatitis - etiology undetermined- not an alcoholic, normal triglycerides and no gallstontes - cont LR at 150 cc/hr, start clears - etiology ? Viral infection, ? Autoimmune, ? Cystic fibrosis- I have asked for a GI consult to help determine the etiology of this- we have ordered Ig G and Cystic Fibrosis gene mutation testing  Time spent in minutes: 35 DVT prophylaxis:  SCDs Code Status: Full code Family Communication: mother  Disposition Plan:  Status is: Inpatient  Remains inpatient appropriate because:acute pancreatitis on IV treatments   Dispo: The patient is from: Home              Anticipated d/c is to: Home              Anticipated d/c date is: 1 day possibly home tomorrow if stable              Patient currently is not medically stable to d/c.      Consultants:   GI Procedures:   none Antimicrobials:  Anti-infectives (From admission, onward)   None       Objective: Vitals:   07/30/20 0036 07/30/20  0106 07/30/20 0135 07/30/20 0513  BP: 113/70 118/75 (!) 123/94 106/70  Pulse: (!) 56 (!) 57 77 (!) 57  Resp: 18 18 17 16   Temp:   98.1 F (36.7 C) 98 F (36.7 C)  TempSrc:   Oral Oral  SpO2: 98% 98% 100% 100%  Weight:      Height:        Intake/Output Summary (Last 24 hours) at 07/30/2020 0921 Last data filed at 07/30/2020 0731 Gross per 24 hour  Intake 1075 ml  Output 700 ml  Net 375 ml   Filed Weights   07/29/20 1431 07/29/20 1722  Weight: 68 kg 67.1 kg    Examination: General exam: Appears comfortable  HEENT: PERRLA, oral mucosa moist, no sclera icterus or thrush Respiratory system: Clear to auscultation. Respiratory effort normal. Cardiovascular system: S1 & S2 heard, RRR.   Gastrointestinal system: Abdomen soft, tender in mid and right upper abdomen, nondistended. Normal bowel sounds. Central nervous system: Alert and oriented. No focal neurological deficits. Extremities: No cyanosis, clubbing or edema Skin: No rashes or ulcers Psychiatry:  Mood & affect appropriate.     Data Reviewed: I have personally reviewed following labs and imaging studies  CBC: Recent Labs  Lab 07/29/20 1657 07/30/20 0214  WBC 11.2* 10.4  HGB 14.4 13.9  HCT 44.0 41.7  MCV 91.3 90.5  PLT 337 268   Basic Metabolic Panel: Recent Labs  Lab 07/29/20 1441 07/30/20 0214  NA 133* 135  K 5.0 4.2  CL 99 102  CO2 23 22  GLUCOSE 123* 112*  BUN 12 11  CREATININE 0.65 0.57  CALCIUM 9.5 8.6*   GFR: Estimated Creatinine Clearance: 98.9 mL/min (by C-G formula based on SCr of 0.57 mg/dL). Liver Function Tests: Recent Labs  Lab 07/29/20 1441 07/30/20 0214  AST 36 23  ALT 22 23  ALKPHOS 47 42  BILITOT 0.9 0.7  PROT 8.4* 7.0  ALBUMIN 4.5 3.9   Recent Labs  Lab 07/29/20 1441  LIPASE 1,958*   No results for input(s): AMMONIA in the last 168 hours. Coagulation Profile: No results for input(s): INR, PROTIME in the last 168 hours. Cardiac Enzymes: No results for input(s):  CKTOTAL, CKMB, CKMBINDEX, TROPONINI in the last 168 hours. BNP (last 3 results) No results for input(s): PROBNP in the last 8760 hours. HbA1C: No results for input(s): HGBA1C in the last 72 hours. CBG: No results for input(s): GLUCAP in the last 168 hours. Lipid Profile: Recent Labs    07/30/20 0214  TRIG 40   Thyroid Function Tests: No results for input(s): TSH, T4TOTAL, FREET4, T3FREE, THYROIDAB in the last 72 hours. Anemia Panel: No results for input(s): VITAMINB12, FOLATE, FERRITIN, TIBC, IRON, RETICCTPCT in the last 72 hours. Urine analysis:    Component Value Date/Time   COLORURINE YELLOW 07/29/2020 1441   APPEARANCEUR CLEAR 07/29/2020 1441   LABSPEC 1.024 07/29/2020 1441   PHURINE 5.0 07/29/2020 1441   GLUCOSEU NEGATIVE 07/29/2020 1441   HGBUR NEGATIVE 07/29/2020 1441   BILIRUBINUR NEGATIVE 07/29/2020 1441   KETONESUR 20 (A) 07/29/2020 1441   PROTEINUR NEGATIVE 07/29/2020 1441   NITRITE NEGATIVE 07/29/2020 1441   LEUKOCYTESUR TRACE (A) 07/29/2020 1441   Sepsis Labs: @LABRCNTIP (procalcitonin:4,lacticidven:4) ) Recent Results (from the past 240 hour(s))  SARS Coronavirus 2 by RT PCR (hospital order, performed in [facility] hospital lab) Nasopharyngeal Nasopharyngeal Swab     Status: None   Collection Time: 07/29/20 10:18 PM   Specimen: Nasopharyngeal Swab  Result Value Ref Range Status   SARS Coronavirus 2 NEGATIVE NEGATIVE Final    Comment: (NOTE) SARS-CoV-2 target nucleic acids are NOT DETECTED.  The SARS-CoV-2 RNA is generally detectable in upper and lower respiratory specimens during the acute phase of infection. The lowest concentration of SARS-CoV-2 viral copies this assay can detect is 250 copies / mL. A negative result does not preclude SARS-CoV-2 infection and should not be used as the sole basis for treatment or other patient management decisions.  A negative result may occur with improper specimen collection / handling, submission of specimen  other than nasopharyngeal swab, presence of viral mutation(s) within the areas targeted by this assay, and inadequate number of viral copies (<250 copies / mL). A negative result must be combined with clinical observations, patient history, and epidemiological information.  Fact Sheet for Patients:   09/28/20  Fact Sheet for Healthcare Providers: BoilerBrush.com.cy  This test is not yet approved or  cleared by the https://pope.com/ FDA and has been authorized for detection and/or diagnosis of SARS-CoV-2 by FDA under an Emergency Use Authorization (EUA).  This EUA will remain in effect (meaning this test can be used) for the duration of the COVID-19 declaration under Section 564(b)(1) of the Act, 21 U.S.C. section 360bbb-3(b)(1), unless the authorization is terminated or revoked sooner.  Performed at [facility], [address] [place], [place] [address]          Radiology Studies: CT ABDOMEN PELVIS W CONTRAST  Result Date: 07/29/2020 CLINICAL DATA:  Generalized abdominal pain with nausea and vomiting. EXAM: CT ABDOMEN AND PELVIS WITH CONTRAST TECHNIQUE: Multidetector CT imaging of the abdomen and pelvis was performed using the standard protocol following bolus administration of intravenous contrast. CONTRAST:  OMNIPAQUE IOHEXOL 300 MG/ML  SOLN COMPARISON:  Ultrasound 07/29/2020 FINDINGS: Lower chest: The lung bases are clear. Hepatobiliary: No focal liver abnormality is seen. No gallstones, gallbladder wall thickening, or biliary dilatation. Pancreas: Diffuse enlargement of the pancreas. Peripancreatic edema. Mild stranding around the pancreas. Changes consistent with acute pancreatitis. No localized fluid collections and no ductal dilatation. Spleen: Normal in size without focal abnormality. Adrenals/Urinary Tract: Adrenal glands are unremarkable. Kidneys are normal, without renal calculi, focal lesion, or  hydronephrosis. Bladder is unremarkable. Stomach/Bowel: Stomach is within normal limits. Appendix appears normal. No evidence of bowel wall thickening, distention, or inflammatory changes. Vascular/Lymphatic: No significant vascular findings are present. No enlarged abdominal or pelvic lymph nodes. Reproductive: Uterus and bilateral adnexa are unremarkable. Other: Small amount of free fluid in the pelvis. This could be physiologic or reactive related to the pancreatitis. Musculoskeletal: No acute or significant osseous findings. IMPRESSION: 1. Changes of acute interstitial edematous pancreatitis. No localized fluid collections and no ductal dilatation. 2. Small amount of free fluid in the pelvis could be physiologic or reactive related to the pancreatitis. Electronically Signed   By: Burman Nieves M.D.   On: 07/29/2020 22:15   US Abdomen Limited RUQ  Result Date: 07/29/2020 CLINICAL DATA:  Abdomen pain EXAM: ULTRASOUND ABDOMEN LIMITED RIGHT UPPER QUADRANT COMPARISON:  None. FINDINGS: Gallbladder: No gallstones or wall thickening visualized. No sonographic Murphy sign noted by sonographer. Common bile duct: Diameter: 3 mm Liver: No focal lesion identified. Within normal limits in parenchymal echogenicity. Portal vein is patent on color Doppler imaging with normal direction of blood flow towards the liver. Other: The pancreas appears enlarged. IMPRESSION: 1. Negative for gallstones. 2. Pancreas appears enlarged, raising possibility of pancreatitis. Correlation with CT may be obtained. Electronically Signed   By: Jasmine Pang M.D.   On: 07/29/2020 21:37      Scheduled Meds: . sodium chloride flush  3 mL Intravenous Q12H   Continuous Infusions: . lactated ringers 150 mL/hr at 07/30/20 0733     LOS: 1 day      Calvert Cantor, MD [facility] Pager: [email or web address] 07/30/2020, 9:21 AM

## 2020-07-31 LAB — BASIC METABOLIC PANEL
Anion gap: 9 (ref 5–15)
BUN: <5 mg/dL — ABNORMAL LOW (ref 6–20)
CO2: 24 mmol/L (ref 22–32)
Calcium: 8.3 mg/dL — ABNORMAL LOW (ref 8.9–10.3)
Chloride: 99 mmol/L (ref 98–111)
Creatinine, Ser: 0.49 mg/dL (ref 0.44–1.00)
GFR calc Af Amer: >60 mL/min (ref >60)
GFR calc non Af Amer: >60 mL/min (ref >60)
Glucose, Bld: 98 mg/dL (ref 70–99)
Potassium: 3.8 mmol/L (ref 3.5–5.1)
Sodium: 132 mmol/L — ABNORMAL LOW (ref 135–145)

## 2020-07-31 LAB — LIPASE, BLOOD: Lipase: 207 U/L — ABNORMAL HIGH (ref 11–51)

## 2020-07-31 LAB — IGG 1, 2, 3, AND 4
IgG (Immunoglobin G), Serum: 1289 mg/dL (ref 586–1602)
IgG, Subclass 1: 523 mg/dL (ref 248–810)
IgG, Subclass 2: 451 mg/dL (ref 130–555)
IgG, Subclass 3: 72 mg/dL (ref 15–102)
IgG, Subclass 4: 160 mg/dL — ABNORMAL HIGH (ref 2–96)

## 2020-07-31 MED ORDER — IBUPROFEN 800 MG PO TABS
400.0000 mg | ORAL_TABLET | Freq: Four times a day (QID) | ORAL | 0 refills | Status: DC | PRN
Start: 2020-07-31 — End: 2020-10-07

## 2020-07-31 NOTE — Progress Notes (Signed)
[facility] Progress Note    Since last GI note: Doing well, ambulating.  Pain under very good control.  Tolerated clears yesterday.  A bit reluctant to advance diet this morning.  IgG 4 and CFTR pending  Objective: Vital signs in last 24 hours: Temp:  [98.5 F (36.9 C)-99.2 F (37.3 C)] 99.2 F (37.3 C) (08/07 0513) Pulse Rate:  [56-76] 75 (08/07 0513) Resp:  [18] 18 (08/07 0513) BP: (125-134)/(73-82) 126/79 (08/07 0513) SpO2:  [98 %-100 %] 98 % (08/07 0513) Last BM Date: 07/27/20 General: alert and oriented times 3 Heart: regular rate and rythm Abdomen: soft, mildly tender epig, non-distended, normal bowel sounds   Lab Results: Recent Labs    07/29/20 1657 07/30/20 0214  WBC 11.2* 10.4  HGB 14.4 13.9  PLT 337 268  MCV 91.3 90.5   Recent Labs    07/29/20 1441 07/30/20 0214 07/31/20 0510  NA 133* 135 132*  K 5.0 4.2 3.8  CL 99 102 99  CO2 23 22 24   GLUCOSE 123* 112* 98  BUN 12 11 <5*  CREATININE 0.65 0.57 0.49  CALCIUM 9.5 8.6* 8.3*   Recent Labs    07/29/20 1441 07/30/20 0214  PROT 8.4* 7.0  ALBUMIN 4.5 3.9  AST 36 23  ALT 22 23  ALKPHOS 47 42  BILITOT 0.9 0.7    Medications: Scheduled Meds: . sodium chloride flush  3 mL Intravenous Q12H   Continuous Infusions: . lactated ringers 150 mL/hr at 07/30/20 2104   PRN Meds:.albuterol, HYDROmorphone (DILAUDID) injection, ondansetron **OR** ondansetron (ZOFRAN) IV, polyethylene glycol   Assessment/Plan: 29 y.o. female with mild acute pancreatitis  Unclear etiology (no stones on 34 and essentially a non-drinker).  For now will say this is related to viral illness she was also suffering from around the same time.  Will advance diet to low fat.  If she tolerates it she is OK to go home with limited narc script.  She understands it may take several more days until she is back to normal.  MY office will contact her about follow up appt in 6 weeks.  Please call or page with any further  questions or concerns.   [place], MD  07/31/2020, 7:30 AM [facility] Pager [phone]

## 2020-07-31 NOTE — Progress Notes (Signed)
Assessment unchanged. Pt and mother verbalized understanding of dc instructions through teach back including medications to resume, follow up care and when to call the doctor. Pt dc'd via foot per request accompanied by mother.

## 2020-07-31 NOTE — Discharge Summary (Signed)
Physician Discharge Summary  [name] TWS:[id number] DOB: Aug 22, 1991 DOA: 07/29/2020  PCP: Patient, No Pcp Per  Admit date: 07/29/2020 Discharge date: 07/31/2020  Admitted From: home Disposition:  home   Recommendations for Outpatient Follow-up:  1. F/u CFTR    Discharge Condition:  stable   CODE STATUS:  Full code   Diet recommendation:  Low fat diet Consultations:  GI  Procedures/Studies: . none   Discharge Diagnoses:  Active Problems:   Acute pancreatitis     Brief Summary: Angela Montes is a 29 y.o.femalepast medical history notable only for asthma who presents with a 1 day history of abdominal pain. She is found to have acute pancreatitis in the ED per imaging and blood work. She does not drink alcohol heavily.  Lipase 1,958 CT abd and Ultrasound abdomen both consistent with acute pancreatitis.   She admits to having a cough for a few weeks for which she took amoxicillin. She recently as prescribedTessalon Perles and an albuterol inhaler prescribed for this. Her maternal aunt has MS and her maternal uncle had transverse myelitis in his 30s and became paralyzed. Also a h/o cystic fibrosis in a paternal uncle.  No recent tick bites.   Hospital Course:  Active Problems:   Acute pancreatitis - etiology undetermined- not an alcoholic, normal triglycerides and no gallstontes - cont LR at 150 cc/hr, start clears - etiology ? Viral infection, ? Autoimmune, ? Cystic fibrosis- I have asked for a GI consult to help determine the etiology of this- we have ordered Ig G and Cystic Fibrosis gene mutation testing - Ig G4 is noted to be slightly elevated at 160- (2-96 is norm)- ? Auto- immune?  - Cystic Fibrosis test pending - she will f/u with GI in 6 wks to discuss the results of the tests   Discharge Exam: Vitals:   07/31/20 0513 07/31/20 1351  BP: 126/79 113/72  Pulse: 75 79  Resp: 18 15  Temp: 99.2 F (37.3 C) 98.3 F (36.8 C)  SpO2: 98% 98%    Vitals:   07/30/20 1330 07/30/20 2036 07/31/20 0513 07/31/20 1351  BP: 125/73 129/82 126/79 113/72  Pulse: 62 76 75 79  Resp: 18 18 18 15   Temp: 98.6 F (37 C) 98.5 F (36.9 C) 99.2 F (37.3 C) 98.3 F (36.8 C)  TempSrc: Oral Oral Oral Oral  SpO2: 98% 99% 98% 98%  Weight:      Height:        General: Pt is alert, awake, not in acute distress Cardiovascular: RRR, S1/S2 +, no rubs, no gallops Respiratory: CTA bilaterally, no wheezing, no rhonchi Abdominal: Soft, NT, ND, bowel sounds + Extremities: no edema, no cyanosis   Discharge Instructions  Discharge Instructions    Diet - low sodium heart healthy   Complete by: As directed    Increase activity slowly   Complete by: As directed      Allergies as of 07/31/2020   No Known Allergies     Medication List    TAKE these medications   albuterol 108 (90 Base) MCG/ACT inhaler Commonly known as: VENTOLIN HFA Inhale 2 puffs into the lungs every 6 (six) hours as needed for wheezing or shortness of breath.   benzonatate 100 MG capsule Commonly known as: TESSALON Take 100 mg by mouth 3 (three) times daily.   ibuprofen 800 MG tablet Commonly known as: ADVIL Take 0.5 tablets (400 mg total) by mouth every 6 (six) hours as needed for moderate pain.  No Known Allergies    CT ABDOMEN PELVIS W CONTRAST  Result Date: 07/29/2020 CLINICAL DATA:  Generalized abdominal pain with nausea and vomiting. EXAM: CT ABDOMEN AND PELVIS WITH CONTRAST TECHNIQUE: Multidetector CT imaging of the abdomen and pelvis was performed using the standard protocol following bolus administration of intravenous contrast. CONTRAST:  OMNIPAQUE IOHEXOL 300 MG/ML  SOLN COMPARISON:  Ultrasound 07/29/2020 FINDINGS: Lower chest: The lung bases are clear. Hepatobiliary: No focal liver abnormality is seen. No gallstones, gallbladder wall thickening, or biliary dilatation. Pancreas: Diffuse enlargement of the pancreas. Peripancreatic edema. Mild  stranding around the pancreas. Changes consistent with acute pancreatitis. No localized fluid collections and no ductal dilatation. Spleen: Normal in size without focal abnormality. Adrenals/Urinary Tract: Adrenal glands are unremarkable. Kidneys are normal, without renal calculi, focal lesion, or hydronephrosis. Bladder is unremarkable. Stomach/Bowel: Stomach is within normal limits. Appendix appears normal. No evidence of bowel wall thickening, distention, or inflammatory changes. Vascular/Lymphatic: No significant vascular findings are present. No enlarged abdominal or pelvic lymph nodes. Reproductive: Uterus and bilateral adnexa are unremarkable. Other: Small amount of free fluid in the pelvis. This could be physiologic or reactive related to the pancreatitis. Musculoskeletal: No acute or significant osseous findings. IMPRESSION: 1. Changes of acute interstitial edematous pancreatitis. No localized fluid collections and no ductal dilatation. 2. Small amount of free fluid in the pelvis could be physiologic or reactive related to the pancreatitis. Electronically Signed   By: Burman Nieves M.D.   On: 07/29/2020 22:15   US Abdomen Limited RUQ  Result Date: 07/29/2020 CLINICAL DATA:  Abdomen pain EXAM: ULTRASOUND ABDOMEN LIMITED RIGHT UPPER QUADRANT COMPARISON:  None. FINDINGS: Gallbladder: No gallstones or wall thickening visualized. No sonographic Murphy sign noted by sonographer. Common bile duct: Diameter: 3 mm Liver: No focal lesion identified. Within normal limits in parenchymal echogenicity. Portal vein is patent on color Doppler imaging with normal direction of blood flow towards the liver. Other: The pancreas appears enlarged. IMPRESSION: 1. Negative for gallstones. 2. Pancreas appears enlarged, raising possibility of pancreatitis. Correlation with CT may be obtained. Electronically Signed   By: Jasmine Pang M.D.   On: 07/29/2020 21:37     The results of significant diagnostics from this  hospitalization (including imaging, microbiology, ancillary and laboratory) are listed below for reference.     Microbiology: Recent Results (from the past 240 hour(s))  SARS Coronavirus 2 by RT PCR (hospital order, performed in [facility] hospital lab) Nasopharyngeal Nasopharyngeal Swab     Status: None   Collection Time: 07/29/20 10:18 PM   Specimen: Nasopharyngeal Swab  Result Value Ref Range Status   SARS Coronavirus 2 NEGATIVE NEGATIVE Final    Comment: (NOTE) SARS-CoV-2 target nucleic acids are NOT DETECTED.  The SARS-CoV-2 RNA is generally detectable in upper and lower respiratory specimens during the acute phase of infection. The lowest concentration of SARS-CoV-2 viral copies this assay can detect is 250 copies / mL. A negative result does not preclude SARS-CoV-2 infection and should not be used as the sole basis for treatment or other patient management decisions.  A negative result may occur with improper specimen collection / handling, submission of specimen other than nasopharyngeal swab, presence of viral mutation(s) within the areas targeted by this assay, and inadequate number of viral copies (<250 copies / mL). A negative result must be combined with clinical observations, patient history, and epidemiological information.  Fact Sheet for Patients:   BoilerBrush.com.cy  Fact Sheet for Healthcare Providers: https://pope.com/  This test is not yet  approved or  cleared by the [place] and has been authorized for detection and/or diagnosis of SARS-CoV-2 by FDA under an Emergency Use Authorization (EUA).  This EUA will remain in effect (meaning this test can be used) for the duration of the COVID-19 declaration under Section 564(b)(1) of the Act, 21 U.S.C. section 360bbb-3(b)(1), unless the authorization is terminated or revoked sooner.  Performed at [facility], [address] [place], [place] [address]      Labs: BNP (last 3 results) No results for input(s): BNP in the last 8760 hours. Basic Metabolic Panel: Recent Labs  Lab 07/29/20 1441 07/30/20 0214 07/31/20 0510  NA 133* 135 132*  K 5.0 4.2 3.8  CL 99 102 99  CO2 23 22 24   GLUCOSE 123* 112* 98  BUN 12 11 <5*  CREATININE 0.65 0.57 0.49  CALCIUM 9.5 8.6* 8.3*   Liver Function Tests: Recent Labs  Lab 07/29/20 1441 07/30/20 0214  AST 36 23  ALT 22 23  ALKPHOS 47 42  BILITOT 0.9 0.7  PROT 8.4* 7.0  ALBUMIN 4.5 3.9   Recent Labs  Lab 07/29/20 1441 07/31/20 0510  LIPASE 1,958* 207*   No results for input(s): AMMONIA in the last 168 hours. CBC: Recent Labs  Lab 07/29/20 1657 07/30/20 0214  WBC 11.2* 10.4  HGB 14.4 13.9  HCT 44.0 41.7  MCV 91.3 90.5  PLT 337 268   Cardiac Enzymes: No results for input(s): CKTOTAL, CKMB, CKMBINDEX, TROPONINI in the last 168 hours. BNP: Invalid input(s): POCBNP CBG: No results for input(s): GLUCAP in the last 168 hours. D-Dimer No results for input(s): DDIMER in the last 72 hours. Hgb A1c No results for input(s): HGBA1C in the last 72 hours. Lipid Profile Recent Labs    07/30/20 0214  TRIG 40   Thyroid function studies No results for input(s): TSH, T4TOTAL, T3FREE, THYROIDAB in the last 72 hours.  Invalid input(s): FREET3 Anemia work up No results for input(s): VITAMINB12, FOLATE, FERRITIN, TIBC, IRON, RETICCTPCT in the last 72 hours. Urinalysis    Component Value Date/Time   COLORURINE YELLOW 07/29/2020 1441   APPEARANCEUR CLEAR 07/29/2020 1441   LABSPEC 1.024 07/29/2020 1441   PHURINE 5.0 07/29/2020 1441   GLUCOSEU NEGATIVE 07/29/2020 1441   HGBUR NEGATIVE 07/29/2020 1441   BILIRUBINUR NEGATIVE 07/29/2020 1441   KETONESUR 20 (A) 07/29/2020 1441   PROTEINUR NEGATIVE 07/29/2020 1441   NITRITE NEGATIVE 07/29/2020 1441   LEUKOCYTESUR TRACE (A) 07/29/2020 1441   Sepsis Labs Invalid input(s): PROCALCITONIN,  WBC,   LACTICIDVEN Microbiology Recent Results (from the past 240 hour(s))  SARS Coronavirus 2 by RT PCR (hospital order, performed in [facility] hospital lab) Nasopharyngeal Nasopharyngeal Swab     Status: None   Collection Time: 07/29/20 10:18 PM   Specimen: Nasopharyngeal Swab  Result Value Ref Range Status   SARS Coronavirus 2 NEGATIVE NEGATIVE Final    Comment: (NOTE) SARS-CoV-2 target nucleic acids are NOT DETECTED.  The SARS-CoV-2 RNA is generally detectable in upper and lower respiratory specimens during the acute phase of infection. The lowest concentration of SARS-CoV-2 viral copies this assay can detect is 250 copies / mL. A negative result does not preclude SARS-CoV-2 infection and should not be used as the sole basis for treatment or other patient management decisions.  A negative result may occur with improper specimen collection / handling, submission of specimen other than nasopharyngeal swab, presence of viral mutation(s) within the areas targeted by this assay, and inadequate  number of viral copies (<250 copies / mL). A negative result must be combined with clinical observations, patient history, and epidemiological information.  Fact Sheet for Patients:   BoilerBrush.com.cyhttps://www.fda.gov/media/136312/download  Fact Sheet for Healthcare Providers: https://pope.com/https://www.fda.gov/media/136313/download  This test is not yet approved or  cleared by the [place] FDA and has been authorized for detection and/or diagnosis of SARS-CoV-2 by FDA under an Emergency Use Authorization (EUA).  This EUA will remain in effect (meaning this test can be used) for the duration of the COVID-19 declaration under Section 564(b)(1) of the Act, 21 U.S.C. section 360bbb-3(b)(1), unless the authorization is terminated or revoked sooner.  Performed at [facility], [address] [place], [place] [id number]      Time coordinating discharge in minutes: 65  SIGNED:   Calvert CantorSaima Savon Bordonaro,  MD  [facility] 07/31/2020, 2:54 PM

## 2020-08-02 ENCOUNTER — Telehealth: Payer: Self-pay

## 2020-08-02 NOTE — Telephone Encounter (Signed)
-----   Message from Rachael Fee, MD sent at 07/31/2020  7:41 AM EDT ----- She is leaving hosp today or tomorrow. Needs OV with me in 6-7 weeks.  Thanks

## 2020-08-02 NOTE — Telephone Encounter (Signed)
appt made on 10/12 at 850 am with Dr Christella Hartigan. The pt will be advised at discharge

## 2020-08-09 LAB — CYSTIC FIBROSIS MUTATION 97
Cystic Fibrosis Mutation 97: ABNORMAL — AB
Interpretation: 2 — AB

## 2020-08-23 ENCOUNTER — Telehealth: Payer: Self-pay | Admitting: Nurse Practitioner

## 2020-08-23 DIAGNOSIS — K859 Acute pancreatitis without necrosis or infection, unspecified: Secondary | ICD-10-CM

## 2020-08-23 NOTE — Telephone Encounter (Signed)
Dr. Christella Hartigan,  Refer to hospital consult 8/6. Pt has follow up appt with you 10/05/2020. See cystic fibrosis mutation results.  Pt does not have a PCP. Do you want her to see pulmonology prior to her appt with you?

## 2020-08-24 NOTE — Telephone Encounter (Signed)
Lab order entered -will discuss with pt who she would like referrals made with.  Also, need the name of her PCP.  Left message on machine to call back

## 2020-08-24 NOTE — Telephone Encounter (Signed)
The pt has been advised and will have labs 1 week prior to the appt with Dr Christella Hartigan.    Pt prefers to have referrals made with her new PCP at [facility] at [address].  I will send records to that office for her appt on 9/9

## 2020-08-24 NOTE — Telephone Encounter (Signed)
Angela Montes, Very interesting, great pickup about her family history of cystic fibrosis.  Her IgG4 was also elevated.  Can you please contact her and let her know about these results.  I think genetic counseling referral is a good idea and also she should see a pulmonologist because of her chronic productive cough.  I will discuss more at the time of her office visit next month.   [name], can she get a repeat IgG testing a week or so prior to that visit?  Thanks

## 2020-08-24 NOTE — Telephone Encounter (Signed)
Angela Montes, I called the patient and I explained her lab results (+ CF mutations and IgG4) levels. She is aware you will contact her to arrange a repeat IgG4 level to be done 1 week prior to her appt with Dr. Christella Hartigan.  She has selected a new Primary doctor with [facility], she could not recall which doctor she is scheduled to see on Thurs 9/9.   I discussed sending her to a pulmonologist and genetic specialist. Do you know since she will have [facility] is it best for her new PCP to facilitate the Pulmonologist and genetic specialist within the [facility] system?  If not, then please enter the pulmonologist and genetic specialist referral. Let me know the outcome. Thx

## 2020-09-02 ENCOUNTER — Other Ambulatory Visit (HOSPITAL_COMMUNITY)
Admission: RE | Admit: 2020-09-02 | Discharge: 2020-09-02 | Disposition: A | Discharge status: Home / Self Care | Payer: BC Managed Care – PPO | Source: Ambulatory Visit | Attending: Physician Assistant | Admitting: Physician Assistant

## 2020-09-02 DIAGNOSIS — Z124 Encounter for screening for malignant neoplasm of cervix: Secondary | ICD-10-CM | POA: Insufficient documentation

## 2020-09-06 LAB — CYTOLOGY - PAP
Adequacy: ABSENT
Comment: NEGATIVE
Diagnosis: NEGATIVE
High risk HPV: NEGATIVE

## 2020-09-08 ENCOUNTER — Telehealth: Payer: Self-pay | Admitting: Gastroenterology

## 2020-09-08 DIAGNOSIS — K859 Acute pancreatitis without necrosis or infection, unspecified: Secondary | ICD-10-CM

## 2020-09-08 NOTE — Telephone Encounter (Signed)
Pt is requesting a call back from Alcide Evener in regards to some appts she had done and to discuss some referrals.

## 2020-09-13 NOTE — Telephone Encounter (Signed)
Angela Montes, this is a patient of Dr. Christella Hartigan. Pls enter a referral for a genetic specialist regarding positive cystic fibrosis genetic mutations. Patient saw her new [facility] PCP and they did not have a genetic specialist to refer her to. THX  Her PCP did refer her to pulmonology, she is waiting to hear back from the pulmonologist's office.

## 2020-09-13 NOTE — Telephone Encounter (Signed)
Referral made to Genetics.  That office will contact pt.

## 2020-09-21 ENCOUNTER — Telehealth: Payer: Self-pay

## 2020-09-21 NOTE — Telephone Encounter (Signed)
I have tried [facility], [facility] and [facility] and none of the facilities accept outside referrals for Cystic fibrosis mutations. They will see patients for prenatal counseling.

## 2020-09-21 NOTE — Telephone Encounter (Signed)
Rachael Fee, MD  Arnaldo Natal, NP; Loretha Stapler, RN I don't know any other genetic specialists.   Colletta Spillers,  Can you reach out to [place], [place] about this? Thanks       Previous Messages   ----- Message -----  From: Arnaldo Natal, NP  Sent: 09/17/2020  4:50 PM EDT  To: Rachael Fee, MD, Loretha Stapler, RN  Subject: RE: Referral update.               Dr. Christella Hartigan do you know of any genetic specialist at [facility] or [facility] as our local genetic specialist does not see CF patients.  ----- Message -----  From: Loretha Stapler, RN  Sent: 09/16/2020  8:19 AM EDT  To: Arnaldo Natal, NP  Subject: RE: Referral update.               This is the message from the genetics office. We may need to have the PCP make the referral.   Good morning,   Our office doesn't see patients for cystic fibrosis mutations. We only see patients with a personal or fhx of cancers.   Sue Lush   ----- Message -----  From: Arnaldo Natal, NP  Sent: 09/15/2020  6:12 PM EDT  To: Loretha Stapler, RN  Subject: RE: Referral update.               Leia Coletti, please explain further, did the genetic specialist office provide a reason to why they will not see this patient?  ----- Message -----  From: Loretha Stapler, RN  Sent: 09/15/2020 11:51 AM EDT  To: Arnaldo Natal, NP  Subject: FW: Referral update.               Our genetics office will not see this pt. Please advise  ----- Message -----  From: Libby Maw  Sent: 09/15/2020 11:31 AM EDT  To: Loretha Stapler, RN, Gloris Ham  Subject: RE: Referral update.               Jaidan Prevette,  See note below from referred to office for referral that you put in.  thanks  ----- Message -----  From: Gloris Ham  Sent: 09/15/2020 11:28 AM EDT  To: Libby Maw  Subject: Referral update.                 Good morning,    Our office doesn't see patients for cystic fibrosis mutations. We only see patients with a personal or fhx of cancers.   Sue Lush

## 2020-09-21 NOTE — Telephone Encounter (Signed)
The pt has been advised and has an appt with Pulmonary on 10/14

## 2020-09-21 NOTE — Telephone Encounter (Signed)
[name], pls let the patient know we don't know any further genetic specialists. She will need to follow up with her PCP to further investigate genetic specialist referral. Thx

## 2020-09-28 ENCOUNTER — Other Ambulatory Visit: Payer: BC Managed Care – PPO

## 2020-09-28 DIAGNOSIS — K859 Acute pancreatitis without necrosis or infection, unspecified: Secondary | ICD-10-CM

## 2020-09-29 LAB — IGG 4: IgG, Subclass 4: 192 mg/dL — ABNORMAL HIGH (ref 2–96)

## 2020-10-05 ENCOUNTER — Ambulatory Visit: Payer: BC Managed Care – PPO | Admitting: Gastroenterology

## 2020-10-05 ENCOUNTER — Encounter: Payer: Self-pay | Admitting: Gastroenterology

## 2020-10-05 VITALS — BP 100/70 | HR 90 | Ht 66.0 in | Wt 141.8 lb

## 2020-10-05 DIAGNOSIS — K859 Acute pancreatitis without necrosis or infection, unspecified: Secondary | ICD-10-CM | POA: Diagnosis not present

## 2020-10-05 NOTE — Progress Notes (Signed)
Review of pertinent gastrointestinal problems: 1.  Mild acute pancreatitis August 2021.  Ultrasound showed no gallstones, LFTs were normal, lipase max about 1000.  Triglycerides normal.  CT scan abdomen pelvis with IV and oral contrast showed "diffuse enlargement of the pancreas.  Peripancreatic edema.  Mild stranding around the pancreas.  Changes consistent with acute pancreatitis.  No fluid collections or ductal dilation."  IgG4 level 160 (upper limit normal 96), repeat IgG4 level October 2021 was 192 (again upper limit normal 96).   2.  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  Might relate to #1 above?  Pulmonology visit pending. cystic fibrosis does run in her family, a paternal uncle died at age 60  HPI: This is a very pleasant 29 year old woman whom I last saw when she was in the hospital about 2 months ago.  She has for the most part recovered from her mild acute pancreatitis but she still does have some lingering mild abdominal discomfort especially in the right upper quadrant.  She tends to be chronically constipated despite probiotic use daily.  She will go every 3 to 4 days and often has to strain.  She has lost about 10 pounds overall since this started, that has stabilized.  She has greatly limited her diet avoiding fat, eating low salt, avoiding alcohol.  She started a new job shortly after her acute illness.  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  ROS: complete GI ROS as described in HPI, all other review negative.  Constitutional:  No unintentional weight loss   Past Medical History:  Diagnosis Date  . Pancreatitis     No past surgical history on file.  Current Outpatient Medications  Medication Sig Dispense Refill  . albuterol (VENTOLIN HFA) 108 (90 Base) MCG/ACT inhaler Inhale 2 puffs into the lungs every 6 (six) hours as needed for wheezing or shortness of breath.     Marland Kitchen ibuprofen (ADVIL) 800 MG tablet Take 0.5 tablets (400 mg  total) by mouth every 6 (six) hours as needed for moderate pain. 30 tablet 0  . benzonatate (TESSALON) 100 MG capsule Take 100 mg by mouth 3 (three) times daily. (Patient not taking: Reported on 10/05/2020)     No current facility-administered medications for this visit.    Allergies as of 10/05/2020  . (No Known Allergies)    Family History  Problem Relation Age of Onset  . Cystic fibrosis Paternal Uncle   . Diabetes Father     Social History   Socioeconomic History  . Marital status: Single    Spouse name: Not on file  . Number of children: Not on file  . Years of education: Not on file  . Highest education level: Not on file  Occupational History  . Not on file  Tobacco Use  . Smoking status: Never Smoker  . Smokeless tobacco: Never Used  Vaping Use  . Vaping Use: Never used  Substance and Sexual Activity  . Alcohol use: Not Currently  . Drug use: Never  . Sexual activity: Not on file  Other Topics Concern  . Not on file  Social History Narrative  . Not on file   Social Determinants of Health   Financial Resource Strain:   . Difficulty of Paying Living Expenses: Not on file  Food Insecurity:   . Worried About Programme researcher, broadcasting/film/video in the Last Year: Not on file  . Ran Out of Food in the Last Year: Not on file  Transportation Needs:   . Freight forwarder (Medical): Not on file  . Lack of Transportation (Non-Medical): Not on file  Physical Activity:   . Days of Exercise per Week: Not on file  . Minutes of Exercise per Session: Not on file  Stress:   . Feeling of Stress : Not on file  Social Connections:   . Frequency of Communication with Friends and Family: Not on file  . Frequency of Social Gatherings with Friends and Family: Not on file  . Attends Religious Services: Not on file  . Active Member of Clubs or Organizations: Not on file  . Attends Banker Meetings: Not on file  . Marital Status: Not on file  Intimate Partner Violence:   .  Fear of Current or Ex-Partner: Not on file  . Emotionally Abused: Not on file  . Physically Abused: Not on file  . Sexually Abused: Not on file     Physical Exam: BP 100/70   Pulse 90   Ht 5\' 6"  (1.676 m)   Wt 141 lb 12.8 oz (64.3 kg)   LMP 09/05/2020 (Approximate)   BMI 22.89 kg/m  Constitutional: generally well-appearing Psychiatric: alert and oriented x3 Abdomen: soft, nontender, nondistended, no obvious ascites, no peritoneal signs, normal bowel sounds No peripheral edema noted in lower extremities  Assessment and plan: 29 y.o. female with mild acute pancreatitis unclear etiology, mild constipation  We had a nice discussion about autoimmune pancreatitis.  I am not sure if that is what she has.  Her IgG4 levels were elevated but not greater than twice the upper limit of normal.  Perhaps she had a virus around that time that irritate her pancreas.  Perhaps there is something to her cystic fibrosis mutation?  She is seeing a pulmonologist later this week about that.  Cystic fibrosis does run in her family.  She certainly has no type of malabsorptive symptoms.  I am getting an MRI with MRCP of her pancreas.  She is going to start fiber supplements for her mild chronic constipation.  She understands that if I become more convinced that she may indeed have autoimmune pancreatitis I may refer her to a tertiary facility for their opinion before committing her and formally labeling her with that diagnosis.  It is admittedly a rare issue and I have never myself seen it in clinical practice in 15 years.  Please see the "Patient Instructions" section for addition details about the plan.  35, MD [facility] 10/05/2020, 9:08 AM   Total time on date of encounter was 32 minutes (this included time spent preparing to see the patient reviewing records; obtaining and/or reviewing separately obtained history; performing a medically appropriate exam and/or evaluation;  counseling and educating the patient and family if present; ordering medications, tests or procedures if applicable; and documenting clinical information in the health record).

## 2020-10-05 NOTE — Patient Instructions (Signed)
If you are age 27 or older, your body mass index should be between 23-30. Your Body mass index is 22.89 kg/m. If this is out of the aforementioned range listed, please consider follow up with your Primary Care Provider.  If you are age 66 or younger, your body mass index should be between 19-25. Your Body mass index is 22.89 kg/m. If this is out of the aformentioned range listed, please consider follow up with your Primary Care Provider.   You have been scheduled for an MRI with MRCP at [facility] (1st- floor Radiation Dept on 10-12-20. Your appointment time is 8:00am. Please arrive 30 minutes prior to your appointment time for registration purposes. Please make certain not to have anything to eat or drink 6 hours prior to your test. In addition, if you have any metal in your body, have a pacemaker or defibrillator, please be sure to let your ordering physician know. This test typically takes 45 minutes to 1 hour to complete. Should you need to reschedule, please call [phone] to do so.  Due to recent changes in healthcare laws, you may see the results of your imaging and laboratory studies on MyChart before your provider has had a chance to review them.  We understand that in some cases there may be results that are confusing or concerning to you. Not all laboratory results come back in the same time frame and the provider may be waiting for multiple results in order to interpret others.  Please give [place] 48 hours in order for your provider to thoroughly review all the results before contacting the office for clarification of your results.   Please start taking citrucel (orange flavored) powder fiber supplement.  This may cause some bloating at first but that usually goes away. Begin with a small spoonful and work your way up to a large, heaping spoonful daily over a week.  Please have pulmonology office note sent to [phone] for our review.  Thank you for entrusting me with your care and  choosing [facility].  Dr Christella Hartigan

## 2020-10-07 ENCOUNTER — Ambulatory Visit (INDEPENDENT_AMBULATORY_CARE_PROVIDER_SITE_OTHER): Payer: BC Managed Care – PPO | Admitting: Internal Medicine

## 2020-10-07 ENCOUNTER — Encounter: Payer: Self-pay | Admitting: Internal Medicine

## 2020-10-07 ENCOUNTER — Other Ambulatory Visit: Payer: Self-pay

## 2020-10-07 NOTE — Progress Notes (Signed)
Angela Montes    [id number]    Feb 18, 1991  Primary Care Physician:Quinn, America Brown, PA  Referring Physician: Adrienne Mocha, PA [address]  [place],  [place] [address] Reason for Consultation: cystic fibrosis Date of Consultation: 10/07/2020  Chief complaint:  Cystic fibrosis   HPI: The patient is a 29 year old woman who is being referred for cystic fibrosis.  She is a history of recurrent upper respiratory infections usually occurring in the wintertime and lingering cough with bronchitis symptoms.  She had childhood asthma as well and notes a positive allergy to cats and dogs.  She denies any exercise tolerance issues she is in fact run 2 half marathons and described result of being very active.  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  She had a URI over the summer which is treated with antibiotics and resolved.  In the beginning of August she had an episode of abdominal pain nausea and vomiting.  She presented to the emergency department and was diagnosed with acute pancreatitis.  Her work-up for gallstones was negative, she did not have any alcohol use.  She is no other risk factors for pancreatitis.  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  Since discharge from the hospital her abdominal pain nausea vomiting are resolved.  She has some postnasal drainage but otherwise no complaints.  Her gastroenterologist is performing an MRI of her abdomen to further evaluate her pancreas, and she has been referred to genetic counselor but has not yet establish care with  One.  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  Grew up in [place] and her family moved to the [place] area when she was in middle school. Smoking history: She is a never smoker  Social History   Occupational History  . Not on file   Tobacco Use  . Smoking status: Never Smoker  . Smokeless tobacco: Never Used  Vaping Use  . Vaping Use: Never used  Substance and Sexual Activity  . Alcohol use: Not Currently  . Drug use: Never  . Sexual activity: Not on file    Relevant family history:  Family History  Problem Relation Age of Onset  . Cystic fibrosis Paternal Uncle   . Diabetes Father     Past Medical History:  Diagnosis Date  . Pancreatitis     History reviewed. No pertinent surgical history.   Physical Exam: Blood pressure 100/70, pulse 82, SpO2 99 %. Gen:      No acute distress ENT:  no nasal polyps, mucus membranes moist +cobblestoning in oropharynx Lungs:    No increased respiratory effort, symmetric chest wall excursion, clear to auscultation bilaterally, no wheezes or crackles CV:         Regular rate and rhythm; no murmurs, rubs, or gallops.  No pedal edema Abd:      + bowel sounds; soft, non-tender; no distension MSK: no acute synovitis of DIP or PIP joints, no mechanics hands.  Skin:      Warm and dry; no rashes Neuro: normal speech, no focal facial asymmetry Psych: alert and oriented x3, normal mood and affect   Data Reviewed/Medical Decision Making:  Independent interpretation of tests: Imaging: . Review of patient's CT abdomen pelvis August 2021 images revealed clear lung bases. The patient's images have been independently reviewed  by me.    PFTs:  Labs:  Lab Results  Component Value Date   WBC 10.4 07/30/2020   HGB 13.9 07/30/2020   HCT 41.7 07/30/2020   MCV 90.5 07/30/2020   PLT 268 07/30/2020   Lab Results  Component Value Date   NA 132 (L) 07/31/2020   K 3.8 07/31/2020   CL 99 07/31/2020   CO2 24 07/31/2020   Blood sugars in the normal range   Immunization status:  Immunization History  Administered Date(s) Administered  . Influenza,inj,Quad PF,6+ Mos 09/05/2020  . PFIZER SARS-COV-2 Vaccination 02/21/2020, 03/16/2020    . I reviewed prior external note(s)  from GI, hospital stay . I reviewed the result(s) of the labs and imaging as noted above.  . I have ordered CT Chest, PFTs  Discussion of management or test interpretation with another colleague.   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 Brazier is a very sweet 29 year old woman with new diagnosis of cystic fibrosis.  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  This explains why her symptoms likely presented so late in age.  We talked at great length about the variable presentation of cystic fibrosis especially in someone who is heterozygote and the various manifestations of this disease.  We are next up should be getting a pulmonary evaluation with pulmonary function testing as well as a CT chest noncontrast.  She does not have any clinical signs or symptoms of bronchiectasis today.  Her blood sugars while in the hospital were in the normal range so does not appear that she has any kind of pancreatic endocrine insufficiency yet.  The presentation of symptoms for patients were heterozygote are widespread and varied.  I do think she should be seen by medical geneticist so we will refer her to 1 today.  Dr. Marylen Ponto medical geneticist with pediatrics at [facility] health and I will reach out to her to discuss where to get her seen.  We discussed disease management and progression at length today.   This note is a late entry and the patient was seen previously without the use of the EMR due to power outage.  I spent 70 minutes in the care of this patient today including pre-charting, chart review, review of results, face-to-face care, coordination of care and communication with consultants etc.).   Return to Care: No follow-ups on file.  Durel Salts, MD Pulmonary and Critical Care Medicine [facility] Office:[phone]  CC: Adrienne Mocha, [place]

## 2020-10-07 NOTE — Patient Instructions (Addendum)
The patient should have follow up scheduled with myself in 2 months.   Prior to next visit patient should have: Full set of PFTs CT Chest non contrast  I am referring you to medical genetics at [facility] - they are the main program for genetics for adults in the state - locally we only have pediatric genetics.

## 2020-10-12 ENCOUNTER — Other Ambulatory Visit: Payer: Self-pay

## 2020-10-12 ENCOUNTER — Ambulatory Visit (HOSPITAL_COMMUNITY)
Admission: RE | Admit: 2020-10-12 | Discharge: 2020-10-12 | Disposition: A | Discharge status: Home / Self Care | Payer: BC Managed Care – PPO | Source: Ambulatory Visit | Attending: Gastroenterology | Admitting: Gastroenterology

## 2020-10-12 ENCOUNTER — Other Ambulatory Visit: Payer: Self-pay | Admitting: Gastroenterology

## 2020-10-12 DIAGNOSIS — K859 Acute pancreatitis without necrosis or infection, unspecified: Secondary | ICD-10-CM

## 2020-10-12 MED ORDER — GADOBUTROL 1 MMOL/ML IV SOLN
7.0000 mL | Freq: Once | INTRAVENOUS | Status: AC | PRN
  Administered 2020-10-12: 7 mL via INTRAVENOUS

## 2020-10-15 ENCOUNTER — Other Ambulatory Visit: Payer: Self-pay

## 2020-10-15 ENCOUNTER — Ambulatory Visit (INDEPENDENT_AMBULATORY_CARE_PROVIDER_SITE_OTHER)
Admission: RE | Admit: 2020-10-15 | Discharge: 2020-10-15 | Disposition: A | Discharge status: Home / Self Care | Payer: BC Managed Care – PPO | Source: Ambulatory Visit | Attending: Internal Medicine | Admitting: Internal Medicine

## 2020-10-15 ENCOUNTER — Telehealth: Payer: Self-pay | Admitting: Gastroenterology

## 2020-10-15 DIAGNOSIS — K859 Acute pancreatitis without necrosis or infection, unspecified: Secondary | ICD-10-CM

## 2020-10-15 NOTE — Telephone Encounter (Signed)
EUS scheduled for 11/11/20 at 730 am with Dr Christella Hartigan COVID test on 11/08/20 at 905 am Lab oder entered. Instructions sent to My Chart

## 2020-10-15 NOTE — Telephone Encounter (Signed)
We spoke on the phone.  I recommended EUS with biopsy of the pancreas to try to nail down diagnosis of AIP with as much certainty as possible.  Also labs.  She agrees.   [name], She needs upper EUS, my next available time for recent pancreatitis. Also labs (lipase, amylase).     Thanks

## 2020-10-15 NOTE — Telephone Encounter (Signed)
EUS scheduled, pt instructed and medications reviewed.  Patient instructions mailed to home.  Patient to call with any questions or concerns.   The pt will come in for labs prior.

## 2020-10-15 NOTE — Telephone Encounter (Signed)
Pt is requesting a call back from Dr Christella Hartigan, she missed his phone call.

## 2020-10-22 ENCOUNTER — Other Ambulatory Visit (INDEPENDENT_AMBULATORY_CARE_PROVIDER_SITE_OTHER): Payer: BC Managed Care – PPO

## 2020-10-22 DIAGNOSIS — K859 Acute pancreatitis without necrosis or infection, unspecified: Secondary | ICD-10-CM | POA: Diagnosis not present

## 2020-10-22 LAB — AMYLASE: Amylase: 49 U/L (ref 27–131)

## 2020-10-22 LAB — LIPASE: Lipase: 61 U/L — ABNORMAL HIGH (ref 11.0–59.0)

## 2020-11-02 ENCOUNTER — Encounter (HOSPITAL_COMMUNITY): Payer: Self-pay | Admitting: Gastroenterology

## 2020-11-02 ENCOUNTER — Other Ambulatory Visit: Payer: Self-pay

## 2020-11-08 ENCOUNTER — Other Ambulatory Visit (HOSPITAL_COMMUNITY)
Admission: RE | Admit: 2020-11-08 | Discharge: 2020-11-08 | Disposition: A | Discharge status: Home / Self Care | Payer: BC Managed Care – PPO | Source: Ambulatory Visit | Attending: Gastroenterology | Admitting: Gastroenterology

## 2020-11-08 DIAGNOSIS — K861 Other chronic pancreatitis: Secondary | ICD-10-CM | POA: Diagnosis present

## 2020-11-08 DIAGNOSIS — Z01812 Encounter for preprocedural laboratory examination: Secondary | ICD-10-CM | POA: Insufficient documentation

## 2020-11-08 DIAGNOSIS — R768 Other specified abnormal immunological findings in serum: Secondary | ICD-10-CM | POA: Diagnosis not present

## 2020-11-08 DIAGNOSIS — Z20822 Contact with and (suspected) exposure to covid-19: Secondary | ICD-10-CM | POA: Insufficient documentation

## 2020-11-08 LAB — SARS CORONAVIRUS 2 (TAT 6-24 HRS): SARS Coronavirus 2: NEGATIVE

## 2020-11-10 NOTE — Anesthesia Preprocedure Evaluation (Addendum)
Anesthesia Evaluation  Patient identified by MRN, date of birth, ID band Patient awake    Reviewed: Allergy & Precautions, NPO status , Patient's Chart, lab work & pertinent test results  Airway Mallampati: I       Dental no notable dental hx.    Pulmonary asthma ,    Pulmonary exam normal        Cardiovascular negative cardio ROS Normal cardiovascular exam     Neuro/Psych negative neurological ROS  negative psych ROS   GI/Hepatic Neg liver ROS,   Endo/Other  negative endocrine ROS  Renal/GU negative Renal ROS  negative genitourinary   Musculoskeletal negative musculoskeletal ROS (+)   Abdominal Normal abdominal exam  (+)   Peds  Hematology   Anesthesia Other Findings   Reproductive/Obstetrics                            Anesthesia Physical Anesthesia Plan  ASA: II  Anesthesia Plan: MAC   Post-op Pain Management:    Induction:   PONV Risk Score and Plan:   Airway Management Planned: Natural Airway, Nasal Cannula and Simple Face Mask  Additional Equipment: None  Intra-op Plan:   Post-operative Plan:   Informed Consent: I have reviewed the patients History and Physical, chart, labs and discussed the procedure including the risks, benefits and alternatives for the proposed anesthesia with the patient or authorized representative who has indicated his/her understanding and acceptance.       Plan Discussed with: CRNA  Anesthesia Plan Comments:        Anesthesia Quick Evaluation

## 2020-11-11 ENCOUNTER — Ambulatory Visit (HOSPITAL_COMMUNITY): Payer: BC Managed Care – PPO | Admitting: Anesthesiology

## 2020-11-11 ENCOUNTER — Ambulatory Visit (HOSPITAL_COMMUNITY)
Admission: RE | Admit: 2020-11-11 | Discharge: 2020-11-11 | Disposition: A | Discharge status: Home / Self Care | Payer: BC Managed Care – PPO | Attending: Gastroenterology | Admitting: Gastroenterology

## 2020-11-11 ENCOUNTER — Other Ambulatory Visit: Payer: Self-pay

## 2020-11-11 ENCOUNTER — Encounter (HOSPITAL_COMMUNITY): Payer: Self-pay | Admitting: Gastroenterology

## 2020-11-11 ENCOUNTER — Encounter (HOSPITAL_COMMUNITY)
Admission: RE | Disposition: A | Discharge status: Home / Self Care | Payer: Self-pay | Source: Home / Self Care | Attending: Gastroenterology

## 2020-11-11 DIAGNOSIS — K861 Other chronic pancreatitis: Secondary | ICD-10-CM | POA: Insufficient documentation

## 2020-11-11 DIAGNOSIS — K869 Disease of pancreas, unspecified: Secondary | ICD-10-CM | POA: Diagnosis not present

## 2020-11-11 DIAGNOSIS — K859 Acute pancreatitis without necrosis or infection, unspecified: Secondary | ICD-10-CM

## 2020-11-11 DIAGNOSIS — Z20822 Contact with and (suspected) exposure to covid-19: Secondary | ICD-10-CM | POA: Insufficient documentation

## 2020-11-11 DIAGNOSIS — R768 Other specified abnormal immunological findings in serum: Secondary | ICD-10-CM | POA: Insufficient documentation

## 2020-11-11 HISTORY — PX: ESOPHAGOGASTRODUODENOSCOPY (EGD) WITH PROPOFOL: SHX5813

## 2020-11-11 HISTORY — PX: EUS: SHX5427

## 2020-11-11 HISTORY — DX: Cystic fibrosis, unspecified: E84.9

## 2020-11-11 HISTORY — PX: FINE NEEDLE ASPIRATION: SHX5430

## 2020-11-11 HISTORY — DX: Unspecified asthma, uncomplicated: J45.909

## 2020-11-11 SURGERY — UPPER ENDOSCOPIC ULTRASOUND (EUS) RADIAL
Anesthesia: Monitor Anesthesia Care

## 2020-11-11 MED ORDER — LACTATED RINGERS IV SOLN: INTRAVENOUS | Status: DC | PRN

## 2020-11-11 MED ORDER — PROPOFOL 10 MG/ML IV BOLUS
INTRAVENOUS | Status: AC
  Filled 2020-11-11: qty 20

## 2020-11-11 MED ORDER — PROPOFOL 10 MG/ML IV BOLUS
INTRAVENOUS | Status: DC | PRN
  Administered 2020-11-11: 30 mg via INTRAVENOUS

## 2020-11-11 MED ORDER — SODIUM CHLORIDE 0.9 % IV SOLN: INTRAVENOUS | Status: DC

## 2020-11-11 MED ORDER — PROPOFOL 500 MG/50ML IV EMUL
INTRAVENOUS | Status: DC | PRN
  Administered 2020-11-11: 180 ug/kg/min via INTRAVENOUS

## 2020-11-11 MED ORDER — LIDOCAINE 2% (20 MG/ML) 5 ML SYRINGE
INTRAMUSCULAR | Status: DC | PRN
  Administered 2020-11-11: 60 mg via INTRAVENOUS

## 2020-11-11 MED ORDER — LACTATED RINGERS IV SOLN: INTRAVENOUS | Status: DC

## 2020-11-11 NOTE — Anesthesia Postprocedure Evaluation (Signed)
Anesthesia Post Note  Patient: Angela Montes  Procedure(s) Performed: UPPER ENDOSCOPIC ULTRASOUND (EUS) RADIAL (N/A ) FINE NEEDLE ASPIRATION (FNA) LINEAR (N/A )     Patient location during evaluation: Endoscopy Anesthesia Type: MAC Level of consciousness: awake Pain management: pain level controlled Vital Signs Assessment: post-procedure vital signs reviewed and stable Respiratory status: spontaneous breathing Cardiovascular status: stable Postop Assessment: no apparent nausea or vomiting Anesthetic complications: no   No complications documented.  Last Vitals:  Vitals:   11/11/20 0807 11/11/20 0830  BP: 116/82   Pulse: 98   Resp: 14 15  Temp: 36.7 C   SpO2:      Last Pain:  Vitals:   11/11/20 0830  TempSrc:   PainSc: 0-No pain   Pain Goal:                   Huston Foley

## 2020-11-11 NOTE — Discharge Instructions (Signed)
YOU HAD AN ENDOSCOPIC PROCEDURE TODAY: Refer to the procedure report and other information in the discharge instructions given to you for any specific questions about what was found during the examination. If this information does not answer your questions, please call [place] office at [phone] to clarify.  ° °YOU SHOULD EXPECT: Some feelings of bloating in the abdomen. Passage of more gas than usual. Walking can help get rid of the air that was put into your GI tract during the procedure and reduce the bloating. If you had a lower endoscopy (such as a colonoscopy or flexible sigmoidoscopy) you may notice spotting of blood in your stool or on the toilet paper. Some abdominal soreness may be present for a day or two, also. ° °DIET: Your first meal following the procedure should be a light meal and then it is ok to progress to your normal diet. A half-sandwich or bowl of soup is an example of a good first meal. Heavy or fried foods are harder to digest and may make you feel nauseous or bloated. Drink plenty of fluids but you should avoid alcoholic beverages for 24 hours. If you had a esophageal dilation, please see attached instructions for diet.   ° °ACTIVITY: Your care partner should take you home directly after the procedure. You should plan to take it easy, moving slowly for the rest of the day. You can resume normal activity the day after the procedure however YOU SHOULD NOT DRIVE, use power tools, machinery or perform tasks that involve climbing or major physical exertion for 24 hours (because of the sedation medicines used during the test).  ° °SYMPTOMS TO REPORT IMMEDIATELY: °A gastroenterologist can be reached at any hour. Please call [phone]  for any of the following symptoms:  °Following lower endoscopy (colonoscopy, flexible sigmoidoscopy) °Excessive amounts of blood in the stool  °Significant tenderness, worsening of abdominal pains  °Swelling of the abdomen that is new, acute  °Fever of 100° or  higher  °Following upper endoscopy (EGD, EUS, ERCP, esophageal dilation) °Vomiting of blood or coffee ground material  °New, significant abdominal pain  °New, significant chest pain or pain under the shoulder blades  °Painful or persistently difficult swallowing  °New shortness of breath  °Black, tarry-looking or red, bloody stools ° °FOLLOW UP:  °If any biopsies were taken you will be contacted by phone or by letter within the next 1-3 weeks. Call [phone]  if you have not heard about the biopsies in 3 weeks.  °Please also call with any specific questions about appointments or follow up tests. ° °

## 2020-11-11 NOTE — Transfer of Care (Signed)
Immediate Anesthesia Transfer of Care Note  Patient: Angela Montes  Procedure(s) Performed: UPPER ENDOSCOPIC ULTRASOUND (EUS) RADIAL (N/A ) FINE NEEDLE ASPIRATION (FNA) LINEAR (N/A )  Patient Location: Endoscopy Unit  Anesthesia Type:MAC  Level of Consciousness: awake, alert , oriented and patient cooperative  Airway & Oxygen Therapy: Patient Spontanous Breathing and Patient connected to face mask oxygen  Post-op Assessment: Report given to RN, Post -op Vital signs reviewed and stable and Patient moving all extremities  Post vital signs: Reviewed and stable  Last Vitals:  Vitals Value Taken Time  BP    Temp    Pulse    Resp    SpO2      Last Pain:  Vitals:   11/11/20 0700  TempSrc: Oral  PainSc: 0-No pain         Complications: No complications documented.

## 2020-11-11 NOTE — Op Note (Addendum)
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 Joscelynn Brutus, MD, Dwain SarnaPatricia Ford, RN, Lawson Radararlene                            Davis, Technician, Rozetta NunneryAnthony Gillies, Technician,                            Steffanie Dunnoshen Edathil CRNA Referring MD:              Medicines:                Monitored Anesthesia Care Complications:            No immediate complications. Estimated blood loss:                            None. Estimated Blood Loss:     Estimated blood loss: none. Procedure:                Pre-Anesthesia Assessment:                           - Prior to the procedure, a History and Physical                            was performed, and patient medications and                            allergies were reviewed. The patient's tolerance of                            previous anesthesia was also reviewed. The risks                            and benefits of the procedure and the sedation                            options and risks were discussed with the patient.                            All questions were answered, and informed consent                            was obtained. Prior Anticoagulants:  The patient has                            taken no previous anticoagulant or antiplatelet                            agents. ASA Grade Assessment: I - A normal, healthy                            patient. After reviewing the risks and benefits,                            the patient was deemed in satisfactory condition to                            undergo the procedure.                            After obtaining informed consent, the endoscope was                            passed under direct vision. Throughout the                            procedure, the patient's blood pressure, pulse, and                            oxygen saturations were monitored continuously. The                            [id number] ([id number]) Olympus Linear EUS was                            introduced through the mouth, and advanced to the                            second part of duodenum. The upper EUS was                            accomplished without difficulty. The patient                            tolerated the procedure well. Scope In: Scope Out: Findings:      ENDOSCOPIC FINDING: :      The examined esophagus was endoscopically normal.      The entire examined stomach was endoscopically normal.      The examined duodenum was endoscopically normal.      ENDOSONOGRAPHIC FINDING: :      1. Pancreatic parenchymal abnormalities were noted in the entire       pancreas. These consisted of hyperechoic strands. The main pancreatic       duct was not discernible. Fine needle aspiration for cytology was       performed. Color Doppler imaging was utilized prior to needle puncture       to confirm a lack of significant vascular  structures within the needle       path. First pass (22 guage, transgastric) cuases a small peripancreatic       hematoma (2.1cm by 0.8cm). The next two passes were done with 25 guage       needle (transduodenal and then transgastric).      2. No peripancreatic adenopathy.      3. CBD was normal, non-dilated.      4. Limited views of the liver, spleen, gallbladder, portal and splenic       vessels were all normal. Impression:               - Slightly irregular pancreatic parenchyma                            throughout the gland (multiple hyperechoic strands)                            and the main pancreatic duct was very thin, not                             discernible. The parenchyma was sampled randomly                            with three FNA passes, one of which caused a small                            peripancreatic hematoma (see above). Cystic                            fibrosis must still be considered as possible                            overarching diagnosis here (pulmonary visit last                            month; genetic, FH, personal history and CT chest                            all consistent with CF). Moderate Sedation:      Not Applicable - Patient had care per Anesthesia. Recommendation:           - Discharge patient to home.                           - Await final pathology results. Procedure Code(s):        --- Professional ---                           [phone], Esophagogastroduodenoscopy, flexible,                            transoral; with transendoscopic ultrasound-guided                            intramural or transmural fine needle  aspiration/biopsy(s), (includes endoscopic                            ultrasound examination limited to the esophagus,                            stomach or duodenum, and adjacent structures) Diagnosis Code(s):        --- Professional ---                           K86.9, Disease of pancreas, unspecified                           R93.3, Abnormal findings on diagnostic imaging of                            other parts of digestive tract CPT copyright 2019 American Medical Association. All rights reserved. The codes documented in this report are preliminary and upon coder review may  be revised to meet current compliance requirements. Rachael Fee, MD 11/11/2020 8:13:59 AM This report has been signed electronically. Number of Addenda: 0

## 2020-11-11 NOTE — H&P (Signed)
HPI: This is a woman with possible AIP   ROS: complete GI ROS as described in HPI, all other review negative.  Constitutional:  No unintentional weight loss   Past Medical History:  Diagnosis Date  . Childhood asthma   . Cystic fibrosis (HCC)   . Pancreatitis     History reviewed. No pertinent surgical history.  Current Facility-Administered Medications  Medication Dose Route Frequency Provider Last Rate Last Admin  . 0.9 %  sodium chloride infusion   Intravenous Continuous Rachael Fee, MD        Allergies as of 10/15/2020  . (No Known Allergies)    Family History  Problem Relation Age of Onset  . Cystic fibrosis Paternal Uncle   . Diabetes Father     Social History   Socioeconomic History  . Marital status: Single    Spouse name: Not on file  . Number of children: Not on file  . Years of education: Not on file  . Highest education level: Not on file  Occupational History  . Not on file  Tobacco Use  . Smoking status: Never Smoker  . Smokeless tobacco: Never Used  Vaping Use  . Vaping Use: Never used  Substance and Sexual Activity  . Alcohol use: Not Currently  . Drug use: Never  . Sexual activity: Not on file  Other Topics Concern  . Not on file  Social History Narrative  . Not on file   Social Determinants of Health   Financial Resource Strain:   . Difficulty of Paying Living Expenses: Not on file  Food Insecurity:   . Worried About Programme researcher, broadcasting/film/video in the Last Year: Not on file  . Ran Out of Food in the Last Year: Not on file  Transportation Needs:   . Lack of Transportation (Medical): Not on file  . Lack of Transportation (Non-Medical): Not on file  Physical Activity:   . Days of Exercise per Week: Not on file  . Minutes of Exercise per Session: Not on file  Stress:   . Feeling of Stress : Not on file  Social Connections:   . Frequency of Communication with Friends and Family: Not on file  . Frequency of Social Gatherings with  Friends and Family: Not on file  . Attends Religious Services: Not on file  . Active Member of Clubs or Organizations: Not on file  . Attends Banker Meetings: Not on file  . Marital Status: Not on file  Intimate Partner Violence:   . Fear of Current or Ex-Partner: Not on file  . Emotionally Abused: Not on file  . Physically Abused: Not on file  . Sexually Abused: Not on file     Physical Exam: Ht 5\' 6"  (1.676 m)   Wt 63.5 kg   LMP 11/05/2020   BMI 22.60 kg/m  Constitutional: generally well-appearing Psychiatric: alert and oriented x3 Abdomen: soft, nontender, nondistended, no obvious ascites, no peritoneal signs, normal bowel sounds No peripheral edema noted in lower extremities  Assessment and plan: 29 y.o. female with for upper EUS evaluation, biopsy of pancreas  Possible AIP  Please see the "Patient Instructions" section for addition details about the plan.  27, MD [facility] 11/11/2020, 7:03 AM

## 2020-11-12 LAB — CYTOLOGY - NON PAP

## 2020-11-15 ENCOUNTER — Encounter (HOSPITAL_COMMUNITY): Payer: Self-pay | Admitting: Gastroenterology

## 2020-12-06 ENCOUNTER — Ambulatory Visit: Payer: BC Managed Care – PPO | Admitting: Internal Medicine

## 2020-12-06 ENCOUNTER — Encounter: Payer: Self-pay | Admitting: Internal Medicine

## 2020-12-06 ENCOUNTER — Ambulatory Visit (INDEPENDENT_AMBULATORY_CARE_PROVIDER_SITE_OTHER): Payer: BC Managed Care – PPO | Admitting: Internal Medicine

## 2020-12-06 ENCOUNTER — Other Ambulatory Visit: Payer: Self-pay

## 2020-12-06 DIAGNOSIS — K859 Acute pancreatitis without necrosis or infection, unspecified: Secondary | ICD-10-CM

## 2020-12-06 LAB — PULMONARY FUNCTION TEST
DL/VA % pred: 108 %
DL/VA: 4.96 ml/min/mmHg/L
DLCO cor % pred: 104 %
DLCO cor: 25.04 ml/min/mmHg
DLCO unc % pred: 104 %
DLCO unc: 25.04 ml/min/mmHg
FEF 25-75 Post: 3.87 L/sec
FEF 25-75 Pre: 3.52 L/sec
FEF2575-%Change-Post: 9 %
FEF2575-%Pred-Post: 106 %
FEF2575-%Pred-Pre: 96 %
FEV1-%Change-Post: 2 %
FEV1-%Pred-Post: 99 %
FEV1-%Pred-Pre: 97 %
FEV1-Post: 3.39 L
FEV1-Pre: 3.32 L
FEV1FVC-%Change-Post: 3 %
FEV1FVC-%Pred-Pre: 97 %
FEV6-%Change-Post: -1 %
FEV6-%Pred-Post: 99 %
FEV6-%Pred-Pre: 100 %
FEV6-Post: 3.97 L
FEV6-Pre: 4.01 L
FEV6FVC-%Pred-Post: 100 %
FEV6FVC-%Pred-Pre: 100 %
FVC-%Change-Post: -1 %
FVC-%Pred-Post: 98 %
FVC-%Pred-Pre: 99 %
FVC-Post: 3.97 L
FVC-Pre: 4.01 L
Post FEV1/FVC ratio: 85 %
Post FEV6/FVC ratio: 100 %
Pre FEV1/FVC ratio: 83 %
Pre FEV6/FVC Ratio: 100 %
RV % pred: 140 %
RV: 2.04 L
TLC % pred: 111 %
TLC: 5.95 L

## 2020-12-06 NOTE — Patient Instructions (Signed)
The patient should have follow up scheduled with myself in 6 months.   Follow up with [facility] and Cystic Fibrosis pulmonary clinic.

## 2020-12-06 NOTE — Progress Notes (Signed)
PFT done today. 

## 2020-12-06 NOTE — Progress Notes (Signed)
Angela Montes    [id number]    01-10-1991  Primary Care Physician:Quinn, America Brown, PA Date of Appointment: 12/06/2020 Established Patient Visit  Chief complaint:   Chief Complaint  Patient presents with  . Follow-up    PFT performed today.  Pt states she has been doing good since last visit and denies any complaints.     HPI: Angela Montes is a 29 y.o. woman with history of cystic fibrosis diagnosed October 2021 by genetic testing.   Interval Updates: Here for follow up after CT scan.  No dyspnea or respiratory issues. No chest tightness or wheezing.   She has no abdominal pain with eating. She has some bloating but no greasy stools.  Has been seen by GI here and been ruled out for AI pancreatitis and has had EGD. Wondering if her siblings should be tested for CF.  She has a brother with asthma and breathing issues.    I have reviewed the patient's family social and past medical history and updated as appropriate.   Past Medical History:  Diagnosis Date  . Childhood asthma   . Cystic fibrosis (HCC)   . Pancreatitis     Past Surgical History:  Procedure Laterality Date  . ESOPHAGOGASTRODUODENOSCOPY (EGD) WITH PROPOFOL N/A 11/11/2020   Procedure: ESOPHAGOGASTRODUODENOSCOPY (EGD) WITH PROPOFOL;  Surgeon: Angela Fee, MD;  Location: [facility];  Service: Endoscopy;  Laterality: N/A;  . EUS N/A 11/11/2020   Procedure: UPPER ENDOSCOPIC ULTRASOUND (EUS) RADIAL;  Surgeon: Angela Fee, MD;  Location: [facility];  Service: Endoscopy;  Laterality: N/A;  . FINE NEEDLE ASPIRATION N/A 11/11/2020   Procedure: FINE NEEDLE ASPIRATION (FNA) LINEAR;  Surgeon: Angela Fee, MD;  Location: [facility];  Service: Endoscopy;  Laterality: N/A;    Family History  Problem Relation Age of Onset  . Cystic fibrosis Paternal Uncle   . Diabetes Father     Social History   Occupational History  . Not on file  Tobacco Use  . Smoking status: Never Smoker   . Smokeless tobacco: Never Used  Vaping Use  . Vaping Use: Never used  Substance and Sexual Activity  . Alcohol use: Not Currently  . Drug use: Never  . Sexual activity: Not on file     Physical Exam: Blood pressure 112/70, pulse 71, height 5\' 6"  (1.676 m), SpO2 100 %.  Gen:      No acute distress Lungs:    No increased respiratory effort, symmetric chest wall excursion, clear to auscultation bilaterally, no wheezes or crackles CV:         Regular rate and rhythm; no murmurs, rubs, or gallops.  No pedal edema   Data Reviewed: Imaging: I have personally reviewed the CT Chest which shows upper lobe predominant cystic bronchiectasis. Bilateral middle and lower lobe tree in bud changes consistent with bronchiolitis.  PFTs:  PFT Results Latest Ref Rng & Units 12/06/2020  FVC-Pre L 4.01  FVC-Predicted Pre % 99  FVC-Post L 3.97  FVC-Predicted Post % 98  Pre FEV1/FVC % % 83  Post FEV1/FCV % % 85  FEV1-Pre L 3.32  FEV1-Predicted Pre % 97  FEV1-Post L 3.39  DLCO uncorrected ml/min/mmHg 25.04  DLCO UNC% % 104  DLCO corrected ml/min/mmHg 25.04  DLCO COR %Predicted % 104  DLVA Predicted % 108  TLC L 5.95  TLC % Predicted % 111  RV % Predicted % 140   I have personally reviewed the patient's  PFTs and they are normal.   Labs: CFTR testing previously reviewed del508 (heterozygote) and A455E positive  Lab Results  Component Value Date   WBC 10.4 07/30/2020   HGB 13.9 07/30/2020   HCT 41.7 07/30/2020   MCV 90.5 07/30/2020   PLT 268 07/30/2020   Lab Results  Component Value Date   NA 132 (L) 07/31/2020   K 3.8 07/31/2020   CL 99 07/31/2020   CO2 24 07/31/2020     Immunization status: Immunization History  Administered Date(s) Administered  . Influenza,inj,Quad PF,6+ Mos 09/05/2020  . 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 Reviewed her imaging, symptoms and PFTs with her and spent extensive time discussing prognosis, management and next steps.  I think based on current situation ok to hold on airway clearance therapy for now. She will follow up with GI.  She has been referred to [facility] genetics and is waiting to be seen.  I will see her 1-2 times/year and be available locally as a resource to her. Discussed having her brother with asthma be seen by a pulmonologist as well.    Return to Care: Return in about 6 months (around 06/06/2021).   Angela Salts, MD Pulmonary and Critical Care Medicine [facility] Office:[phone]

## 2020-12-08 ENCOUNTER — Ambulatory Visit: Admit: 2020-12-08 | Discharge: 2020-12-09 | Payer: PRIVATE HEALTH INSURANCE

## 2020-12-16 ENCOUNTER — Ambulatory Visit
Admit: 2020-12-16 | Discharge: 2020-12-17 | Payer: PRIVATE HEALTH INSURANCE | Attending: Registered" | Primary: Registered"

## 2020-12-16 ENCOUNTER — Ambulatory Visit: Admit: 2020-12-16 | Discharge: 2020-12-17 | Payer: PRIVATE HEALTH INSURANCE

## 2020-12-16 ENCOUNTER — Ambulatory Visit
Admit: 2020-12-16 | Discharge: 2020-12-17 | Payer: PRIVATE HEALTH INSURANCE | Attending: Pharmacist | Primary: Pharmacist

## 2020-12-16 MED ORDER — SODIUM CHLORIDE 3.5 % FOR NEBULIZATION
Freq: Every day | RESPIRATORY_TRACT | 5 refills | 0.00000 days | Status: CP
Start: 2020-12-16 — End: ?
  Filled 2020-12-29: qty 240, 60d supply, fill #0

## 2020-12-20 MED ORDER — ALBUTEROL SULFATE 2.5 MG/3 ML (0.083 %) SOLUTION FOR NEBULIZATION
RESPIRATORY_TRACT | 11 refills | 0.00000 days | Status: CP
Start: 2020-12-20 — End: ?
  Filled 2020-12-29: qty 270, 22d supply, fill #0

## 2021-01-06 MED ORDER — ELEXACAFTOR 100 MG-TEZACAF 50MG-IVACAF 75MG(D)/IVACAF 150MG(N) TABLETS
ORAL_TABLET | 3 refills | 0 days | Status: CP
Start: 2021-01-06 — End: ?
  Filled 2021-01-12: qty 84, 28d supply, fill #0

## 2021-01-06 NOTE — Unmapped (Signed)
[facility] Specialty Medication Onboarding    Specialty Medication: Trikafta  Prior Authorization: Approved   Financial Assistance: No - copay  <$25  Final Copay/Day Supply: $0 / 28    Insurance Restrictions: None     Notes to Pharmacist: None    The triage team has completed the benefits investigation and has determined that the patient is able to fill this medication at [facility]. Please contact the patient to complete the onboarding or follow up with the prescribing physician as needed.

## 2021-01-06 NOTE — Unmapped (Signed)
Addended by: Cicero Duck on: 01/06/2021 09:23 AM     Modules accepted: Orders

## 2021-01-12 ENCOUNTER — Ambulatory Visit: Admit: 2021-01-12 | Discharge: 2021-01-13 | Payer: PRIVATE HEALTH INSURANCE

## 2021-01-12 NOTE — Unmapped (Signed)
[facility]   Patient Onboarding/Medication Counseling    Patient is advised to start after results from sweat chloride test today.     Darlene Sutton is a 30 y.o. female with Cystic Fibrosis who I am counseling today on initiation of therapy.  I am speaking to the patient.    Was a Nurse, learning disability used for this call? No    Verified patient's date of birth / HIPAA.    Specialty medication(s) to be sent: CF/Pulmonary: -Trikafta      Non-specialty medications/supplies to be sent: n/a      Medications not needed at this time: n/a         TRIKAFTA (elexacaftor/tezacaftor/ivacaftor and ivacaftor)     Used for the treatment of cystic fibrosis (CF) in patients aged 6 years and older who have at least 1 copy of the F508del mutation or at least one other responsive mutation.    Medication & Administration     How Supplied:   ??? Each month supply of TRIKAFTA comes as a 84-count tablet carton that contains 4 weekly wallets, each wallet contains 14 tablets of elexacaftor/tezacaftor/ivacaftor and 7 tablets of ivacaftor)  ??? TRIKAFTA is co-packaged as a fixed dose combination tablet of elexacaftor/tezacaftor/ivacaftor and a separate ivacaftor tablet  o 69-84 years old and weigh < 30 kg: 2 orange tablets (marked T50) as the combination of elexacaftor, tezacaftor and ivacaftor. 1 blue tablet (marked V75) contains only ivacaftor.    Dosing: Patients 6-11 years (30 kg+) or 12 years and older: 2 orange tablets (elexacaftor 100mg /tezacaftor 50mg /ivacaftor 75mg  per tablet) and 1 blue tablet (ivacaftor 150mg ) in the evening, 12 hours apart. Taken with fatty food.    Administration:   ??? Take with food that contains high fat.  o Examples: eggs, butter, peanut butter, nuts, avocado, or whole-milk dairy products.     Missed dose instructions:  ??? If AM dose missed (ORANGE tablets):  If less than 6 hours since your usual AM dose: Take missed AM dose as soon as you remember Take PM dose at the usual time   If more than 6 hours since your usual AM dose: Take missed AM dose as soon as you remember SKIP the PM dose that day     ??? If you missed the PM dose (BLUE tablets):  If less than 6 hours since your usual PM dose: Take missed PM dose as soon as you remember Take tomorrow's AM dose at the usual time   If more than 6 hours since your usual PM dose: SKIP the PM dose that day Take tomorrow's AM dose at the usual time     Storage:   ??? Store at room temperature    Goals of Therapy     To help improve lung function and decrease likelihood of complications and hospitalizations due to Cystic Fibrosis.    Common Side Effects     ??? Rash   ??? Abdominal pain and/or diarrhea  ??? Headache, dizziness  ??? Runny nose  ??? Influenza     Warning and Precautions     Please note that some patients have experienced a temporary increase in their sputum production shortly after starting TRIKAFTA. This may be a sign that TRIKAFTA is working to help clear your secretions. If you feel sick, short of breath, have a fever, or blood in your sputum, please call the CF Clinic as you normally would.    Monitoring:  ??? For adult patients: Monitor for hepatic impairment.  Hepatic Impairment:   ??? Your team with check your liver function tests (LFTs) prior to starting treatment and 3 months the first year you are on TRIKAFTA to assess any changes that may occur.   ??? If stable after the first year, your liver function tests will be checked annually.  ??? Notify your physician if you start noticing any of the following symptoms:  o Yellowing of the skin or the white part of your eyes (jaundice)  o Nausea or vomiting, diarrhea, or abdominal pain  o Dark or amber-colored urine  o Bruising or bleeding, itching, rash  o Fever      Drug/Food Interactions     ??? Medication list reviewed in Epic. The patient was instructed to inform the care team before taking any new medications or supplements. No drug interactions identified.   ??? Avoid grapefruit and grapefruit juice and [place] oranges  ??? Avoid St John's Wort  ??? Most common medications that interact: Strong and Moderate CYP3A4 Inhibitors (such as the azole antifungals) and Strong CYP3A4 Inducers (such as rifampin).  ??? Inform providers if you start to take any new medications please check with your CF provider prior to use as it may interact with TRIKAFTA.    Storage, Handling Precautions, & Disposal     ??? Store at room temperature         Current Medications (including OTC/herbals), Comorbidities and Allergies     Current Outpatient Medications   Medication Sig Dispense Refill   ??? albuterol 2.5 mg /3 mL (0.083 %) nebulizer solution Inhale 1 vial (2.5 mg total) by nebulization daily before hypertonic saline nebs. May also inhale 1 vial (2.5 mg total) every six (6) hours as needed for shortness of breath/wheezing. 300 mL 11   ??? elexacaftor-tezacaftor-ivacaft (TRIKAFTA) 100-50-75 mg(d) /150 mg (n) tablet Take 2 Tablets (Elexacaftor 100mg /Tezacaftor 50mg /Ivacaftor 75mg ) by mouth in the morning and 1 tablet (ivacaftor 150mg ) in the evening with fatty food 84 tablet 3   ??? loratadine (CLARITIN) 10 mg tablet Take 10 mg by mouth daily as needed.     ??? psyllium husk (METAMUCIL) 0.4 gram cap Take by mouth daily as needed.     ??? sodium chloride 3.5 % Nebu Inhale 1 ampule by nebulization once daily. 240 mL 5     No current facility-administered medications for this visit.       No Known Allergies    Patient Active Problem List   Diagnosis   ??? Acute pancreatitis   ??? Cystic fibrosis (CMS-HCC)       Reviewed and up to date in Epic.    Appropriateness of Therapy     Is medication and dose appropriate based on diagnosis? Yes    Prescription has been clinically reviewed: Yes    Baseline Quality of Life Assessment      How many days over the past month did your CF  keep you from your normal activities? For example, brushing your teeth or getting up in the morning. 0    Financial Information     Medication Assistance provided: Prior Authorization, Copay Assistance and [employer]    Anticipated copay of $0 reviewed with patient. Verified delivery address.    Delivery Information     Scheduled delivery date: 01/13/21    Expected start date: 01/13/21    Medication will be delivered via UPS to the prescription address in [facility].  This shipment will not require a signature.      Explained the services we  provide at [facility] and that each month we would call to set up refills.  Stressed importance of returning phone calls so that we could ensure they receive their medications in time each month.  Informed patient that we should be setting up refills 7-10 days prior to when they will run out of medication.  A pharmacist will reach out to perform a clinical assessment periodically.  Informed patient that a welcome packet and a drug information handout will be sent.      Patient verbalized understanding of the above information as well as how to contact the pharmacy at [phone] option 4 with any questions/concerns.  The pharmacy is open Monday through Friday 8:30am-4:30pm.  A pharmacist is available 24/7 via pager to answer any clinical questions they may have.    Patient Specific Needs     - Does the patient have any physical, cognitive, or cultural barriers? No    - Patient prefers to have medications discussed with  Patient     - Is the patient or caregiver able to read and understand education materials at a high school level or above? Yes    - Patient's primary language is  English     - Is the patient high risk? No    - Does the patient require a Care Management Plan? No     - Does the patient require physician intervention or other additional services (i.e. nutrition, smoking cessation, social work)? No      Julianne Rice  [facility] Specialty Pharmacist

## 2021-02-02 MED ORDER — ALBUTEROL SULFATE HFA 90 MCG/ACTUATION AEROSOL INHALER
11 refills | 0.00000 days | Status: CP
Start: 2021-02-02 — End: ?
  Filled 2021-02-03: qty 8.5, 25d supply, fill #0

## 2021-02-02 NOTE — Unmapped (Signed)
The patient's home spirometer was shipped. The tracking number is [id number]. The patient was notified via My Chart.

## 2021-02-02 NOTE — Unmapped (Signed)
[facility] Clinical Assessment & Refill Coordination Note    Not clearing her throat as much and not coughing up as much mucous since starting Trikafta.  She was a little bloated in the beginning but she used Miralax and that seemed to help. This has subsided now.     Darlene Sutton, DOB: 09-12-1991  Phone: [phone] (home)     All above HIPAA information was verified with patient.     Was a Nurse, learning disability used for this call? No    Specialty Medication(s):   CF/Pulmonary: -Trikafta     Current Outpatient Medications   Medication Sig Dispense Refill   ??? albuterol 2.5 mg /3 mL (0.083 %) nebulizer solution Inhale 1 vial (2.5 mg total) by nebulization daily before hypertonic saline nebs. May also inhale 1 vial (2.5 mg total) every six (6) hours as needed for shortness of breath/wheezing. 300 mL 11   ??? elexacaftor-tezacaftor-ivacaft (TRIKAFTA) 100-50-75 mg(d) /150 mg (n) tablet Take 2 Tablets (Elexacaftor 100mg /Tezacaftor 50mg /Ivacaftor 75mg ) by mouth in the morning and 1 tablet (ivacaftor 150mg ) in the evening with fatty food 84 tablet 3   ??? loratadine (CLARITIN) 10 mg tablet Take 10 mg by mouth daily as needed.     ??? psyllium husk (METAMUCIL) 0.4 gram cap Take by mouth daily as needed.     ??? sodium chloride 3.5 % Nebu Inhale 1 ampule by nebulization once daily. 240 mL 5     No current facility-administered medications for this visit.        Changes to medications: Sendy reports no changes at this time.    No Known Allergies    Changes to allergies: No    SPECIALTY MEDICATION ADHERENCE     Trikafta 100 mg: 9 days of medicine on hand       Medication Adherence    Patient reported X missed doses in the last month: 0  Specialty Medication: Trikafta 100mg   Patient is on additional specialty medications: No  Informant: patient          Specialty medication(s) dose(s) confirmed: Regimen is correct and unchanged.     Are there any concerns with adherence? No    Adherence counseling provided? Not needed    CLINICAL MANAGEMENT AND INTERVENTION      Clinical Benefit Assessment:    Do you feel the medicine is effective or helping your condition? Yes    Clinical Benefit counseling provided? Not needed    Adverse Effects Assessment:    Are you experiencing any side effects? No    Are you experiencing difficulty administering your medicine? No    Quality of Life Assessment:    How many days over the past month did your CF  keep you from your normal activities? For example, brushing your teeth or getting up in the morning. 0    Have you discussed this with your provider? Not needed    Therapy Appropriateness:    Is therapy appropriate? Yes, therapy is appropriate and should be continued    DISEASE/MEDICATION-SPECIFIC INFORMATION      For CF patients: CF [employer]well [facility] Active? Yes    PATIENT SPECIFIC NEEDS     - Does the patient have any physical, cognitive, or cultural barriers? No    - Is the patient high risk? No    - Does the patient require a Care Management Plan? No     - Does the patient require physician intervention or other additional services (i.e. nutrition, smoking cessation, social  work)? No      SHIPPING     Specialty Medication(s) to be Shipped:   CF/Pulmonary: -Trikafta    Other medication(s) to be shipped: albuteorl HFA     Changes to insurance: No    Delivery Scheduled: Yes, Expected medication delivery date: 2/11.     Medication will be delivered via UPS to the confirmed prescription address in [facility].    The patient will receive a drug information handout for each medication shipped and additional FDA Medication Guides as required.  Verified that patient has previously received a Conservation officer, historic buildings.    All of the patient's questions and concerns have been addressed.    Julianne Rice   [facility] Specialty Pharmacist

## 2021-02-02 NOTE — Unmapped (Signed)
Adult Cystic Fibrosis Clinic Pharmacist Note      Received prescription request for albuterol HFA for patient.     1. Cystic fibrosis (CMS-HCC)    - albuterol HFA 90 mcg/actuation inhaler; Inhale 2 puffs daily prior to hypertonic saline nebs. May also inhale 2 puffs every six (6) hours as needed for shortness of breath/wheezing.  Dispense: 8 g; Refill: 11    Pharmacy sent to:  [facility]    Electronically signed by:  Barbette Hair, PharmD, MPH, BCPS, CPP  Clinical Pharmacist Practitioner  [facility]  [phone]

## 2021-02-03 MED FILL — TRIKAFTA 100-50-75 MG (D)/150 MG (N) TABLETS: 28 days supply | Qty: 84 | Fill #1

## 2021-02-24 MED FILL — HYPER-SAL 3.5 % SOLUTION FOR NEBULIZATION: RESPIRATORY_TRACT | 60 days supply | Qty: 240 | Fill #1

## 2021-02-28 NOTE — Unmapped (Signed)
[facility] Refill Coordination Note    Specialty Medication(s) to be Shipped:   CF/Pulmonary: -Trikafta  Other medication(s) to be shipped: Albuterol HFA, Albuterol 2.5mg /49ml neb solu      Darlene Sutton, DOB: September 30, 1991  Phone: [phone] (home)     All above HIPAA information was verified with patient.     Was a Nurse, learning disability used for this call? No    Completed refill call assessment today to schedule patient's medication shipment from the [facility] [phone]).       Specialty medication(s) and dose(s) confirmed: Regimen is correct and unchanged.   Changes to medications: Leticia reports no changes at this time.  Changes to insurance: No  Questions for the pharmacist: No    Confirmed patient received Welcome Packet with first shipment. The patient will receive a drug information handout for each medication shipped and additional FDA Medication Guides as required.       DISEASE/MEDICATION-SPECIFIC INFORMATION        For CF patients: CF Healthwell [facility] Active? Yes, **HWG TX til 11/15/21**    SPECIALTY MEDICATION ADHERENCE     Medication Adherence    Patient reported X missed doses in the last month: 0  Specialty Medication: Trikafta  Patient is on additional specialty medications: No  Patient is on more than two specialty medications: No  Informant: patient  Reliability of informant: reliable  Reasons for non-adherence: no problems identified        Trikafta: 9 days of medicine on hand     SHIPPING     Shipping address confirmed in Epic.     Delivery Scheduled: Yes, Expected medication delivery date: 03/03/2021.     Medication will be delivered via UPS to the prescription address in Epic [facility].    Rodriquez Thorner P [facility] [place] [facility] Specialty Technician

## 2021-02-28 NOTE — Unmapped (Signed)
[place] [facility]    Home Spirometry Monitoring      02/28/21        I have reviewed 2 home spirometry test(s) over prior calendar month.  Most recent test is normal.  Compared to prior home spirometry values, the most recent test is better. Best in-clinic FEV1 in last year was 3.32 L (97%) on 12/06/20.         Quality of most recent test graded A.     Follow-up plan:   ??? Reached out to patient via MyChart.  ??? Continue monthly home spirometry monitoring for the management of cystic fibrosis.    Durenda Hurt [place], PA-C  [facility]   [phone]

## 2021-03-02 MED FILL — ALBUTEROL SULFATE 2.5 MG/3 ML (0.083 %) SOLUTION FOR NEBULIZATION: RESPIRATORY_TRACT | 22 days supply | Qty: 270 | Fill #1

## 2021-03-02 MED FILL — TRIKAFTA 100-50-75 MG (D)/150 MG (N) TABLETS: 28 days supply | Qty: 84 | Fill #2

## 2021-03-02 MED FILL — ALBUTEROL SULFATE HFA 90 MCG/ACTUATION AEROSOL INHALER: 25 days supply | Qty: 8.5 | Fill #1

## 2021-03-09 NOTE — Telephone Encounter (Signed)
Dr. Celine Mans, This is just an FYI, an update from the patient.  Thank you.

## 2021-03-17 ENCOUNTER — Ambulatory Visit: Admit: 2021-03-17 | Discharge: 2021-03-18 | Payer: PRIVATE HEALTH INSURANCE

## 2021-03-17 NOTE — Unmapped (Addendum)
Goals and plans we discussed today:  1. Great to see you today!  2. Move your evening Trikafta up to dinner time and let me know if that helps with your sleep   3. Take Miralax 1 capful every other day - goal 1 BM daily or at least every other day   4. Check your home spirometer when you get home and then once per month   5. Come back for an appointment after your trip in July   6. Reach out over MyChart if you have any questions    Thank you for allowing [place] to be a part of your care!     To reach your CF nurse coordinators:    Patients with the last name A-K: Joni Reining [phone]  Patients with the last name L-Z: Harriett Sine [phone]     For urgent issues after hours/weekends:  Hospital Operator: [phone]) [phone], ask for Pulmonary Fellow on-call     To make or change a clinic appointment:   [facility]: [phone]     --> When you should use MyChart:           - Order a prescription refill          - View test results          - Send a non-urgent message or update to the care team          - View after-visit summaries           - See or pay bills      --> When you should call (NOT use MyChart)           - Increase in cough          - Change in amount of mucus or mucus color           - Coughing up blood or blood-tinged mucus          - Chest pain          - Shortness of breath           - Lack of energy, feeling sick, or increase in tiredness     --> I don't have a MyChart. Why should I get one?           - It's encrypted, so your information is secure          - It's a quick, easy way to contact the care team, manage appointments, see test results, and more!      --> How do I sign-up for MyChart?            - Download the MyChart app from the [employer] or [employer] and sign-up in the app           - Sign-up online at [email or web address]

## 2021-03-17 NOTE — Unmapped (Signed)
[facility]  Adult Cystic Fibrosis Clinic      Met with Darlene Sutton in person at her clinic visit. She states she is doing well from a CF stand point and she has been telling some of her close friends about her CF. She also participated in the CF peer connect program and has been able to talk with someone around her same age about her CF which she has found helpful. She started Trikafta but has not noticed any adverse side effects at this time except some difficulty staying asleep. She addressed this with her provider, Durenda Hurt  [facility], and she will start taking her Trikafta a little earlier in the evening to see if that helps her sleep. No other SW needs identified at this time.

## 2021-03-17 NOTE — Unmapped (Signed)
Discussed the patient's need for a portable nebulizer compressor and disposable cups for an upcoming trip in June. After speaking with Amy the social worker and the provider, a plan was made to reach out to an organization for available assistance. No other needs at this time.

## 2021-03-17 NOTE — Unmapped (Signed)
Pulmonary Cystic Fibrosis Clinic Note    ASSESSMENT     Darlene Sutton is a 30 y.o. female with cystic fibrosis who presents to the [facility] for a follow-up visit. 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 She had a CT Chest that showed evidence of bronchiectasis. Since our last visit, she started Trikafta and has noticed an improvement in throat clearing. She has also added Albuterol and Hypertonic Saline 3% once daily with the Aerobika for airway clearance. Her FEV1 is improved to 108% from 97% at her local Pulmonologist last year.    Of note, she has a history of seasonal and environmental allergies. She has some allergic rhinitis currently but no sinus congestion or pressure. She has had wheezing after exposure to dogs and recently found it helpful to use Albuterol after an exposure. However, she doesn't have any daily asthma symptoms.     Her fecal elastase was normal indicating that she is pancreatic sufficient. She has chronic constipation and has benefited from adding Citrucel and Miralax. Recommended titrating down on Citrucel to 1/2 dose per day and increasing Miralax to every other day.     Problem List Items Addressed This Visit        Respiratory    Cystic fibrosis (CMS-HCC) - Primary    Relevant Orders    CF Sputum/ CF Sinus Culture       Other    Seasonal allergies    Constipation          PLAN     1) Collected CF sputum culture via OP swab - unable to expectorate any sputum   2) Continue Albuterol MDI, then Hypertonic Saline 3% + Aerobika once daily for airway clearance given bronchiectasis  3) Discussed a plan for continuing her inhaled regimen while on her trip out [place] this Summer - seen by RT, discussed portable nebulizer and disposable neb cups  4) Continue Trikafta - monitor LFTs q29months for the first year, due for LFT's at end of April 2022   5) Continue monthly home spirometry monitoring - of note, FEV1 was ~120% on her home spirometer in early March 2022, unclear whether this difference may be related to technique as she's new to spirometry or if there's an asthma component; will consider completing pre/post home spirometry   6) For her constipation, decrease Citrucel to half dose daily and increase Miralax to 1 dose every other day; may also consider follow up with local GI +/- adding back a probiotic  7) Patient has received a Flu vaccine and 3 COVID-19 vaccines   8) She has not been able to schedule a visit with [facility] since her referral last year, I'll reach out to them   9) Discussed CFF resources including [employer]   10) She is established with a local PCP for women's health     The patient will return to clinic in 3 months, or sooner if clinically indicated.    Durenda Hurt Barn[place], PA-C  [facility]   [phone]    SUBJECTIVE:        Darlene Sutton is a 30 y.o. year old female with cystic fibrosis who comes in today for a routine CF follow-up visit. 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 She established care with our CF Clinic in 11/2020. Since her last visit, she started Trikafta and had increased mucus production for 2 days afterwards. She now has noticed decreased throat clearing compared to prior. She also started Hypertonic Saline 3% in addition to the Albuterol she was already taking. She uses those meds once daily in the evening and has tolerated them well. She reports having seasonal allergy symptoms currently which include itchy, watery eyes and runny nose. She is allergic to cats and dogs. When she's around them, she gets itchy, red eyes, sneezing and wheezing. Claritin has been helpful in the past but she only takes it as needed due to it causing bloating. She was recently around her friends dog and when she got home, she used Albuterol and it helped a lot with the wheezing. She currently denies cough, sputum production, chest pain, wheezing or shortness of breath. She denies sinus congestion, pressure or headaches. She was told by her dentist that one of her nasal passages is blocked.     After starting Trikafta, she noticed increased abdominal bloating. She was advised to trial Miralax and took it once daily for a week. This helped the bloating. She has continued on Citrucel every morning and now takes Miralax about once per week. She sometimes has a BM once daily and sometimes it can be every 2-3 days. She continues to have regular bloating especially in the evening. Her usual diet includes oatmeal for breakfast, greek yogurt, string cheese and almonds for lunch, and chicken rice bowls with broccoli for dinner. She likes to eat ice cream and doesn't have worsened symptoms after eating it. She has tried probiotic gummies in the past but stopped them when she started Citrucel. She continues to have an intermittent dull right-sided discomfort in her abdomen since last year.     She has noticed waking up in the middle of the night more frequently and having trouble falling back asleep. She'll wake up around 12:30 am and won't be able to fall asleep until around 2 am. She denies any unusual stress or change in her routine.     She lives alone in [place], and her parents live in [place]. She has 2 sisters and 2 brothers. Her youngest brother who is 61 years old has asthma as well. Her father's brother died at age 79 from cystic fibrosis. 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 She is planning to go on her yearly trip with [employer] for 3 weeks this Summer.     CF Overview:     Genetics: F508del / A455E   CF Modulator Tx: Trikafta - started late January 2022    Airway Clearance Regimen: Waymon Budge       Exercise Habits: Walking     Typical bacteria: MSSA     Last CF sputum culture: 03/17/21  Last AFB culture: 12/16/20    Inhaled ABX: None     Last oral antibiotics: Z-pack (date unknown)   Last IV antibiotics: Never     The patient judges that exacerbations requiring any antibiotic intervention (oral, inhaled, IV) are occurring about <1 times per year.    Hemoptysis: no  ABPA:  no  Ptx:  no  O2 needs:      no  Port:  no  Sinus symptoms (congestion, rhinorrhea, pain) are present and this is felt to be worse than last visit.  Panc Insuf: No (fecal elastase normal at 378 on 12/28/20)   PEG:  no  Supplements: no  DIOS:  no  CF Liver Dz:   no  Patient is having approximately 1 stools daily to every 2-3 days.  Stools are described as formed in appearance.     Last Colonoscopy: None (not due until age 72 for screening)    Diabetes: no.   Last OGTT: none; (Fasting n/a; 2 hour n/a)   HgbA1C: 12/16/20 (5.0%)    Osteopenia: no   Last DEXA: None     Depression: no  Anxiety:  no  Adherence to medications and airway clearance is rated as Excellent.      Medications:  Reviewed with patient and updated in EPIC.  Outpatient Encounter Medications as of 03/17/2021   Medication Sig Dispense Refill   ??? albuterol 2.5 mg /3 mL (0.083 %) nebulizer solution Inhale 1 vial (2.5 mg total) by nebulization daily before hypertonic saline nebs. May also inhale 1 vial (2.5 mg total) every six (6) hours as needed for shortness of breath/wheezing. 300 mL 11   ??? albuterol HFA 90 mcg/actuation inhaler Inhale 2 puffs daily prior to hypertonic saline nebs. May also inhale 2 puffs every six (6) hours as needed for shortness of breath/wheezing. 8.5 g 11   ??? elexacaftor-tezacaftor-ivacaft (TRIKAFTA) 100-50-75 mg(d) /150 mg (n) tablet Take 2 Tablets (Elexacaftor 100mg /Tezacaftor 50mg /Ivacaftor 75mg ) by mouth in the morning and 1 tablet (ivacaftor 150mg ) in the evening with fatty food 84 tablet 3   ??? loratadine (CLARITIN) 10 mg tablet Take 10 mg by mouth daily as needed.     ??? psyllium husk (METAMUCIL) 0.4 gram cap Take by mouth daily as needed.     ??? sodium chloride 3.5 % Nebu Inhale 1 ampule by nebulization once daily. 240 mL 5     No facility-administered encounter medications on file as of 03/17/2021.       Allergies:  No Known Allergies    Past Medical History:  Past Medical History:   Diagnosis Date   ??? Asthma    ??? 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 Pancreatitis, acute        Past Surgical History:  No past surgical history on file.    Social History:  Social History     Socioeconomic History   ??? Marital status: Single     Spouse name: None   ??? Number of children: None   ??? Years of education: None   ??? Highest education level: None   Occupational History   ??? Occupation: Runner, broadcasting/film/video / Therapist, sports: [employer]   Tobacco Use   ??? Smoking status: Never Smoker   ??? Smokeless tobacco: Never Used   Substance and Sexual Activity   ??? Alcohol use: None   ??? Drug use: None   ??? Sexual activity: None   Other Topics Concern   ??? None   Social History Narrative   ??? None     Social Determinants of Health     Financial Resource Strain: Not on file   Food Insecurity: Not on file   Transportation Needs: Not on file   Physical Activity: Not on file   Stress: Not on file   Social Connections: Not on file       Family History:  Family History   Problem Relation Age of Onset   ??? No Known  Problems Mother    ??? No Known Problems Father    ??? No Known Problems Sister    ??? No Known Problems Brother    ??? Cystic fibrosis Paternal Uncle    ??? No Known Problems Sister    ??? Asthma Brother        Review of Systems:  A 12 point review of systems was negative except for pertinent items noted in the HPI.        OBJECTIVE:   Objective   Physical exam:   Vitals:    03/17/21 1423   BP: 117/88   BP Site: L Arm   BP Position: Sitting   BP Cuff Size: Medium   Pulse: 84   Resp: 18   Temp: 36.8 ??C (98.2 ??F)   TempSrc: Skin   SpO2: 99%     There is no height or weight on file to calculate BMI.  Wt Readings from Last 3 Encounters:   12/16/20 65.9 kg (145 lb 3.2 oz)       GEN: Cooperative female, sitting up on exam table, NAD  HEAD: Normocephalic, atraumatic  EYES: PERRLA, anicteric sclerae, conjuctiva clear  EARS: TM's are normal bilaterally  NOSE: Nares and mucosa normal with midline septum, no drainage or sinus tenderness.  OROPHARYNX: Pink and moist without erythema or exudate  NECK: Supple, trachea midline  HEART/CV: RRR, S1, S2 nl, no MRG  LUNGS: CTA bilaterally, no crackles or wheezes, normal WOB on RA  ABD: NABS, soft, NT/ND, no rebound or guarding, no masses, no hepatomegaly noted   EXT: No cyanosis, clubbing, or edema  SKIN: No rashes or lesions noted  NEURO: No focal deficits noted  PSYCH: Awake, alert, and interactive. Mood and affect appropriate.     Pulmonary Function Testing Results:      Interpretation: Spirometry is normal and FEV1 is increased from her last testing at OSH on 12/06/20.     Performed on 12/06/20 at [facility].   Results per CareEverywhere note:   FVC-Pre L 4.01   FVC-Predicted Pre % 99   FVC-Post L 3.97   FVC-Predicted Post % 98   Pre FEV1/FVC % % 83   Post FEV1/FCV % % 85   FEV1-Pre L 3.32   FEV1-Predicted Pre % 97   FEV1-Post L 3.39   DLCO uncorrected ml/min/mmHg 25.04   DLCO [place]% % 104   DLCO corrected ml/min/mmHg 25.04   DLCO COR %Predicted % 104   DLVA Predicted % 108   TLC L 5.95   TLC % Predicted % 111   RV % Predicted % 140     I reviewed interval medical records.    CF Monitoring:     Pertinent Laboratory Data:  CBC  No results found for: WBC, HGB, HCT, PLT    ELECTROLYTES  Lab Results   Component Value Date    Sodium 138 12/16/2020     Lab Results   Component Value Date    Potassium 3.5 12/16/2020     Lab Results   Component Value Date    Chloride 104 12/16/2020     Lab Results   Component Value Date    CO2 24.6 12/16/2020     Lab Results   Component Value Date    BUN 11 12/16/2020     Lab Results   Component Value Date    Creatinine 0.61 12/16/2020     Lab Results   Component Value Date    Glucose 101 12/16/2020  Lab Results   Component Value Date    Calcium 10.2 12/16/2020     Lab Results   Component Value Date    Anion Gap 9 12/16/2020       GI  Lab Results   Component Value Date    AST 22 12/16/2020     Lab Results   Component Value Date    ALT 17 12/16/2020     Lab Results   Component Value Date    Alkaline Phosphatase 59 12/16/2020     Lab Results   Component Value Date    Total Protein 7.8 12/16/2020     Lab Results   Component Value Date    Albumin 4.4 12/16/2020     Lab Results   Component Value Date    GGT <7 12/16/2020     Lab Results   Component Value Date    Total Bilirubin 0.6 12/16/2020       VITAMIN LEVELS / INR:    Lab Results   Component Value Date    Vitamin A 36.4 12/16/2020     No results found for: VITD2  No results found for: VITD3  Lab Results   Component Value Date    Vitamin D Total (25OH) 32.6 12/16/2020     Lab Results   Component Value Date    Vitamin E 7.3 12/16/2020       Lab Results   Component Value Date    PT 11.7 12/16/2020     Lab Results   Component Value Date    INR 1.00 12/16/2020       IGE  Lab Results   Component Value Date    IgE, Total 1,041 (H) 12/16/2020       SPUTUM CULTURES  CF Sputum Culture (no units)   Date Value   03/17/2021 4+ Oropharyngeal Flora Isolated   03/17/2021 <1+ Staphylococcus aureus (A)     AFB Smear (no units)   Date Value   12/16/2020     NO ACID FAST BACILLI SEEN- 3 negative smears do not exclude pulmonary TB. If active pulmonary TB is suspected, continue airborne isolation until pulmonary disease is excluded by negative cultures.     AFB Culture (no units)   Date Value   12/16/2020 No Acid Fast Bacilli Detected       Pertinent Imaging Data:  CT Chest (10/15/20): Com[facility] at [facility].   Impression:   1. Areas of bronchiectasis, bronchial wall thickening and cystic change with upper lobe predominance, pattern of disease that could   be seen in the setting of cystic fibrosis. Atypical infection could have a similar appearance.   2. Tree-in-bud opacities in the superior segment of the RIGHT lower lobe and scattered small pulmonary nodules about the periphery of   the RIGHT upper lobe. Correlate with any respiratory symptoms.??No consolidation.   3. Incidental imaging of upper abdominal contents shows indistinct boundaries of the pancreas perhaps related to prior pancreatitis. No   overt edema in the upper abdomen. Low-dose nature of the scan also limits detail in these areas, correlate with any ongoing abdominal   symptoms.     MRCP (10/12/20): Completed at [facility]   Impression:   1. No specific pancreatic abnormality to determine etiology of pancreatitis. Subtle indistinct margin through the neck and head of   the pancreas.??Pancreatic duct is not identified.   2. No secondary signs of autoimmune pancreatitis.   3. Normal common bile duct. ??No cholelithiasis.   4. No evidence  of acute pancreatic inflammation.     CT Abd/Pelvis (07/29/20): Completed at [facility]  Impression:   1. Changes of acute??interstitial edematous pancreatitis. No localized fluid collections and no ductal dilatation.   2. Small amount of free fluid in the pelvis could be physiologic or reactive related to the pancreatitis.     Other Health Maintenance:   Vaccines:   Immunization History   Administered Date(s) Administered   ??? COVID-19 VACC,MRNA,(PFIZER)(PF)(IM) 02/21/2020, 03/16/2020, 10/15/2020   ??? DTaP 12/28/1992, 02/17/1993, 04/28/1993, 05/01/1994, 10/27/1996   ??? HPV Quadrivalent (Gardasil) 06/02/2009, 09/17/2009, 06/10/2010   ??? Hepatitis A Vaccine Pediatric / Adolescent 2 Dose IM 11/14/2006, 06/02/2009   ??? Hepatitis B vaccine, pediatric/adolescent dosage, 02/09/1992, 12/28/1992, 07/28/1993   ??? HiB-PRP-T 12/28/1992, 02/17/1993, 04/28/1993, 02/02/1994   ??? Influenza Recomb PF (Quad) Injectable(Egg Free)18+ 09/02/2020   ??? Influenza Virus Vaccine, unspecified formulation 09/05/2020   ??? MMR 02/02/1994, 10/27/1996   ??? Meningococcal Conjugate MCV4P 06/10/2009   ??? PPD Test 04/14/2014   ??? Poliovirus,inactivated (IPV) 12/28/1992, 03/17/1993, 07/28/1993, 05/01/1994, 10/27/1996   ??? TdaP 06/10/2010, 09/10/2020   ??? Tetanus-Diptheria Toxoids-TD(TDVAX),Asdorbed,2LF(IM) 02/17/2005   ??? Varicella 11/06/1994, 11/14/2006

## 2021-03-22 MED ORDER — FLUTICASONE PROPIONATE 50 MCG/ACTUATION NASAL SPRAY,SUSPENSION
Freq: Two times a day (BID) | NASAL | 11 refills | 0 days | Status: CP
Start: 2021-03-22 — End: 2022-03-22

## 2021-03-23 NOTE — Unmapped (Signed)
[facility] Refill Coordination Note    Specialty Medication(s) to be Shipped:   CF/Pulmonary: -Trikafta  Other medication(s) to be shipped: No additional medications requested for fill at this time     Darlene Sutton, DOB: 06-27-1991  Phone: [phone] (home)     All above HIPAA information was verified with patient.     Was a Nurse, learning disability used for this call? No    Completed refill call assessment today to schedule patient's medication shipment from the [facility] [phone]).       Specialty medication(s) and dose(s) confirmed: Regimen is correct and unchanged.   Changes to medications: Guilianna reports no changes at this time.  Changes to insurance: No  Questions for the pharmacist: No    Confirmed patient received a Conservation officer, historic buildings and a Surveyor, mining with first shipment. The patient will receive a drug information handout for each medication shipped and additional FDA Medication Guides as required.       DISEASE/MEDICATION-SPECIFIC INFORMATION        For CF patients: CF Healthwell Grant Active? Yes, **HWG TX til 11/15/21**    SPECIALTY MEDICATION ADHERENCE     Medication Adherence    Patient reported X missed doses in the last month: 0  Specialty Medication: Trikafta  Patient is on additional specialty medications: No  Patient is on more than two specialty medications: No  Informant: patient  Reliability of informant: reliable  Reasons for non-adherence: no problems identified        Trikafta: 10+ days of medicine on hand     SHIPPING     Shipping address confirmed in Epic.     Delivery Scheduled: Yes, Expected medication delivery date: 03/29/2021.     Medication will be delivered via UPS to the prescription address in Epic WAM.    Raymund Manrique P Wetzel [place] [facility] Specialty Technician

## 2021-03-28 MED FILL — TRIKAFTA 100-50-75 MG (D)/150 MG (N) TABLETS: 28 days supply | Qty: 84 | Fill #3

## 2021-03-28 NOTE — Unmapped (Signed)
[facility]    Home Spirometry Monitoring      03/28/21        I have reviewed 3 home spirometry test(s) over prior calendar month.  Most recent test is normal.  Compared to prior home spirometry values, the most recent test is unchanged. Best in-clinic FEV1 in last year was 3.75 L (108%) on 03/17/21.           Quality of most recent test graded E.     Follow-up plan:   ??? Reached out to patient via MyChart.  ??? Continue monthly home spirometry monitoring for the management of cystic fibrosis.    Durenda Hurt [place], PA-C  [facility]   [phone]

## 2021-04-19 NOTE — Unmapped (Signed)
Adult CF Clinic Pharmacist Note: CFTR Modulator Lab Monitoring     [place] [address].  Start date/month: 12/2020     Latest LFTs done: 04/15/2021  AST   Date Value Ref Range Status   04/15/2021 42 (H) 0 - 40 U/L Final   12/16/2020 22 <=34 U/L Final     ALT   Date Value Ref Range Status   04/15/2021 46 (H) 0 - 32 U/L Final   12/16/2020 17 10 - 49 U/L Final     Alkaline Phosphatase   Date Value Ref Range Status   04/15/2021 78 44 - 121 U/L Final   12/16/2020 59 46 - 116 U/L Final     Total Bilirubin   Date Value Ref Range Status   04/15/2021 0.3 0.0 - 1.2 mg/dL Final   95/63/8756 0.6 0.3 - 1.2 mg/dL Final     Assessment/Plan:  ?? AST abnormal (< 3X ULN). ALT abnormal (< 3X ULN)  ?? Tbili WNL  ??? Discussed with PA-C Rupcich and will continue CFTR modulator therapy.  ??? Next LFTs due in 3 months: 06/2021    Barbette Hair, PharmD, MPH, BCPS, CPP  Clinical Pharmacist Practitioner  [facility]  [phone]

## 2021-04-20 MED ORDER — TRIKAFTA 100-50-75 MG (D)/150 MG (N) TABLETS
ORAL_TABLET | 3 refills | 0 days | Status: CP
Start: 2021-04-20 — End: ?
  Filled 2021-04-25: qty 84, 28d supply, fill #0

## 2021-04-20 NOTE — Unmapped (Signed)
[facility] Refill Coordination Note    Specialty Medication(s) to be Shipped:   CF/Pulmonary: -Trikafta  Other medication(s) to be shipped: Albuterol HFA & Hyper-Sal 3.5% neb solu     Darlene Sutton, DOB: 05-Mar-1991  Phone: [phone] (home)     All above HIPAA information was verified with patient.     Was a Nurse, learning disability used for this call? No    Completed refill call assessment today to schedule patient's medication shipment from the [facility] [phone]).  All relevant notes have been reviewed.     Specialty medication(s) and dose(s) confirmed: Regimen is correct and unchanged.   Changes to medications: Karlina reports no changes at this time.  Changes to insurance: No  New side effects reported not previously addressed with a pharmacist or physician: None reported  Questions for the pharmacist: No    Confirmed patient received a Conservation officer, historic buildings and a Surveyor, mining with first shipment. The patient will receive a drug information handout for each medication shipped and additional FDA Medication Guides as required.       DISEASE/MEDICATION-SPECIFIC INFORMATION        For CF patients: CF Healthwell Grant Active? Yes, **HWG TX til 11/15/21**    SPECIALTY MEDICATION ADHERENCE     Medication Adherence    Patient reported X missed doses in the last month: 0  Specialty Medication: Trikafta  Patient is on additional specialty medications: No  Patient is on more than two specialty medications: No  Informant: patient  Reliability of informant: reliable  Reasons for non-adherence: no problems identified        Were doses missed due to medication being on hold? No    Trikafta: 10 days of medicine on hand     REFERRAL TO PHARMACIST     Referral to the pharmacist: Not needed    [facility]     Shipping address confirmed in Epic.     Delivery Scheduled: Yes, Expected medication delivery date: 04/26/2021.     Medication will be delivered via UPS to the prescription address in Epic WAM.    Raziya Aveni P Wetzel [place] [facility] Specialty Technician

## 2021-04-20 NOTE — Unmapped (Signed)
Adult Cystic Fibrosis Clinic Pharmacist Note      Received prescription renewal request for [facility].     1. Cystic fibrosis (CMS-HCC)    - elexacaftor-tezacaftor-ivacaft (TRIKAFTA) 100-50-75 mg(d) /150 mg (n) tablet; Take 2 Tablets (Elexacaftor 100mg /Tezacaftor 50mg /Ivacaftor 75mg ) by mouth in the morning and 1 tablet (ivacaftor 150mg ) in the evening with fatty food  Dispense: 84 tablet; Refill: 3    Pharmacy sent to:  [facility]    Electronically signed by:  Barbette Hair, PharmD, MPH, BCPS, CPP  Clinical Pharmacist Practitioner  [facility]ulmonary [facility]  [phone]

## 2021-04-22 LAB — CBC W/ DIFFERENTIAL
BANDED NEUTROPHILS ABSOLUTE COUNT: 0 10*3/uL (ref 0.0–0.1)
BASOPHILS ABSOLUTE COUNT: 0 10*3/uL (ref 0.0–0.2)
BASOPHILS RELATIVE PERCENT: 1 %
EOSINOPHILS ABSOLUTE COUNT: 0.2 10*3/uL (ref 0.0–0.4)
EOSINOPHILS RELATIVE PERCENT: 5 %
HEMATOCRIT: 39.6 % (ref 34.0–46.6)
HEMOGLOBIN: 13.8 g/dL (ref 11.1–15.9)
IMMATURE GRANULOCYTES: 0 %
LYMPHOCYTES ABSOLUTE COUNT: 1.2 10*3/uL (ref 0.7–3.1)
LYMPHOCYTES RELATIVE PERCENT: 35 %
MEAN CORPUSCULAR HEMOGLOBIN CONC: 34.8 g/dL (ref 31.5–35.7)
MEAN CORPUSCULAR HEMOGLOBIN: 31.2 pg (ref 26.6–33.0)
MEAN CORPUSCULAR VOLUME: 89 fL (ref 79–97)
MONOCYTES ABSOLUTE COUNT: 0.3 10*3/uL (ref 0.1–0.9)
MONOCYTES RELATIVE PERCENT: 8 %
NEUTROPHILS ABSOLUTE COUNT: 1.8 10*3/uL (ref 1.4–7.0)
NEUTROPHILS RELATIVE PERCENT: 51 %
PLATELET COUNT: 276 10*3/uL (ref 150–450)
RED BLOOD CELL COUNT: 4.43 x10E6/uL (ref 3.77–5.28)
RED CELL DISTRIBUTION WIDTH: 11.7 % (ref 11.7–15.4)
WHITE BLOOD CELL COUNT: 3.5 10*3/uL (ref 3.4–10.8)

## 2021-04-22 LAB — ALLERGEN REGION II PROFILE
COCKROACH, AMERICAN (ALLSCRIPTS): 0.29 kU/L — AB
D001-IGE D PTERONYSSINUS: 0.73 kU/L — AB
D002-IGE D FARINAE MITE: 0.52 kU/L — AB
E001-IGE CAT HAIR/DANDER,STAN: 70.4 kU/L — AB
E005-IGE DOG DANDER: >100 kU/L — AB
G002-IGE BERMUDA GRASS: <0.1 kU/L
G006-IGE TIMOTHY: 2.31 kU/L — AB
G010-IGE JOHNSON GRASS: <0.1 kU/L
G017-IGE BAHIA GRASS: <0.1 kU/L
IGE MAPLE LEAF/SYCAMORE: <0.1 kU/L
M001-IGE PENICILLIUM CHRYSOGEN: <0.1 kU/L
M002-IGE CLADOSPORIUM HERBARU: <0.1 kU/L
M003-IGE ASPERGILLUS FUMIGATU: <0.1 kU/L
M004-IGE MUCOR RACEMOSUS: <0.1 kU/L
M006-IGE ALTERNARIA TENUIS: <0.1 kU/L
M010-IGE STEMPHYLIUM HERBARUM: <0.1 kU/L
T001-IGE MAPLE/BOX ELDER: 0.5 kU/L — AB
T003-IGE COMMON SILVER BIRCH: <0.1 kU/L
T006-IGE CEDAR, MOUNTAIN: 3.04 kU/L — AB
T007-IGE OAK, WHITE: <0.1 kU/L
T008-IGE ELM, AMERICAN (WHITE: 0.42 kU/L — AB
T041-IGE HICKORY, WHITE: 0.91 kU/L — AB
T070-IGE WHITE MULBERRY: <0.1 kU/L
T211-IGE SWEET GUM: 0.28 kU/L — AB
W001-IGE RAGWEED, SHORT/COMMO: 0.1 kU/L — AB
W006-IGE MUGWORT: <0.1 kU/L
W009-IGE PLANTAIN, ENGLISH: <0.1 kU/L
W014-IGE PIGWEED, ROUGH: <0.1 kU/L
W018-IGE SHEEP SORREL(DOCK): <0.1 kU/L
W020-IGE NETTLE: 0.11 kU/L — AB

## 2021-04-22 LAB — HEPATIC FUNCTION PANEL
ALBUMIN: 4.4 g/dL (ref 3.9–5.0)
ALKALINE PHOSPHATASE: 78 IU/L (ref 44–121)
ALT (SGPT): 46 IU/L — ABNORMAL HIGH (ref 0–32)
AST (SGOT): 42 IU/L — ABNORMAL HIGH (ref 0–40)
BILIRUBIN DIRECT: 0.13 mg/dL (ref 0.00–0.40)
BILIRUBIN TOTAL: 0.3 mg/dL (ref 0.0–1.2)
TOTAL PROTEIN: 7.2 g/dL (ref 6.0–8.5)

## 2021-04-25 MED FILL — ALBUTEROL SULFATE HFA 90 MCG/ACTUATION AEROSOL INHALER: 25 days supply | Qty: 8.5 | Fill #2

## 2021-04-25 MED FILL — HYPER-SAL 3.5 % SOLUTION FOR NEBULIZATION: RESPIRATORY_TRACT | 60 days supply | Qty: 240 | Fill #2

## 2021-04-27 NOTE — Unmapped (Signed)
[place] [facility]    Home Spirometry Monitoring      04/27/21        I have reviewed 1 home spirometry test(s) over prior calendar month.  Most recent test demonstrates normal.  Compared to prior home spirometry values, the most recent test is unchanged. Best in-clinic FEV1 in last year was 3.75 L (108%) on 03/17/21.         Quality of most recent test graded D.     Follow-up plan:   ??? Reached out to patient via MyChart.  ??? Continue monthly home spirometry monitoring for the management of cystic fibrosis.    Durenda Hurt [place], PA-C  [facility]   [phone]

## 2021-05-04 ENCOUNTER — Telehealth: Admit: 2021-05-04 | Discharge: 2021-05-05 | Payer: PRIVATE HEALTH INSURANCE | Attending: MS" | Primary: MS"

## 2021-05-14 NOTE — Unmapped (Addendum)
[facility]ET[facility] CONSULTATION    Conducted via Video secondary to [facility] cancellation for COVID-19     Date of Video Encounter: 05/04/2021  Time of Video Call: 9:00am   Patient Name: Darlene Sutton  Patient Age: 30 y.o.  Patient Location at Time of Encounter: Patient was at work in [place], [place]    REASON FOR GENETICS CONSULTATION: Evaluate for hereditary cancer susceptibility     Referring Provider: Durel Salts, MD [facility])  Primary Care Provider: Adrienne Mocha, PA [facility])  Genetic Counselor: Darlene Hair, MS, [facility]  Attending Geneticist: Darlene Belts, MD, PhD  _______________________________________________________________________________________________    ASSESSMENT:   Darlene Sutton is a 30 y.o. 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 She presented to clinic today to discuss the results of her genetic testing and implications for her family members. She is being managed by the care team at the [facility] clinic at [facility]. The details of our visit are below.     PLAN:   (1) No additional genetic testing was performed today.     (2) Darlene Sutton will speak with her family siblings about genetic counseling and testing.     Darlene Sutton had an opportunity to ask questions, which were answered to her satisfaction. It was a pleasure to participate in Darlene Sutton's care. Darlene Sutton has our contact information and is encouraged to communicate with [place] at any time with questions or concerns.   _______________________________________________________________________________________________    HISTORY OF PRESENTING INDICATION:   Darlene Sutton is a 30 y.o. female being evaluated by the [facility] at the request of Darlene Salts, MD, for consultation to discuss Darlene Sutton's recent diagnosis of cystic fibrosis. Darlene Sutton attended clinic via video visit to discuss this further. The history is collected from available records in Epic and verbal report.     PERSONAL HISTORY:  Darlene Sutton personal history is significant for a diagnosis of cystic fibrosis (CF) at age 34 following an ED admission for acute pancreatitis. She endorses a history of frequent upper respiratory tract infections (URIs) usually occurring during wintertime and treated with antibiotics. Of note, a paternal uncle died of cystic fibrosis at age 23.     Patient's genetic test results:  Genetic test results were provided and reviewed. Results available in Epic Media tab (dated 05/20/2021)    Lab: [facility]  Test ordered: Cystic Fibrosis Mutation - 97  Ordering provider: Calvert Cantor, MD  Institution: [facility]  Date of results: 08/09/2020  Result: [id number] and A455E (not stated whether these variants are in cis or in trans)      FAMILY HISTORY:   During the visit, we obtained a four-generation pedigree. The most relevant history to note is as follows:   ?? Darlene Sutton has no children.  ?? Darlene Sutton has four siblings:  ?? Sister, Darlene Sutton, 82, healthy, no children.  ?? Sister, Darlene Sutton, 25, healthy, no children.  ?? Brother, Darlene Sutton, 22, healthy, no children.  ?? Brother, Darlene Sutton, 19, history of asthma, no children.  ?? Maternal Family History:  ?? Darlene Sutton mother is 82 and is healthy.  ?? There is a maternal uncle who is 18 and is paraplegic secondary to a stroke. He has two daughters who are healthy.  ?? There is a maternal aunt who is 30 and is healthy. She has two daughters and a son, all of whom  are healthy.  ?? Darlene Sutton maternal grandfather passed away from heart failure at 24. Her maternal grandmother is 79 and is generally ok.  ?? Paternal Family History:  ?? Darlene Sutton father is 70 and has a history of asthma and diabetes.   ?? A paternal uncle passed away from cystic fibrosis at the age of 2.  ?? Another paternal uncle is 67 and is healthy. His two children are doing well.  ?? There are two paternal aunts, ages 73 and 73, both of whom are healthy. The 60yo aunt has three healthy children. The aunt who is 41 has a daughter who died from an overdose, and another daughter who required a heart transplant for congenital heart disease. The third daughter is healthy.  ?? Darlene Sutton paternal grandmother is 6 and has a history of diabetes. Her paternal grandfather died at 42 but the cause of his death is unknown. He had a history of tobacco and alcohol use.    Darlene Sutton maternal and paternal ancestry is Mixed European. There is no known Jewish ancestry and no consanguinity. A more detailed family history is available in her pedigree under the media tab in the electronic medical record (EMR).     DISCUSSION:    Cystic Fibrosis:  (GeneReviews, cystic fibrosis foundation) Cystic fibrosis (CF) is an inherited, multisystem disease disease affecting epithelia of the respiratory tract, exocrine pancreas, intestine, hepatobiliary system, and exocrine sweat glands. It is characterized the buildup of thick, sticky mucus that can damage many of the body's organs. The most common signs and symptoms include progressive damage to the respiratory system and chronic digestive system problems. The features of the disorder and their severity varies among affected individuals. Morbidities include progressive obstructive lung disease with bronchiectasis, frequent hospitalizations for pulmonary disease, pancreatic insufficiency and malnutrition, recurrent sinusitis and bronchitis, and female infertility. Pulmonary disease is the major cause of morbidity and mortality in CF. Meconium ileus occurs at birth in 15%-20% of newborns with CF. More than 95% of males with CF are infertile. Congenital absence of the vas deferens (CAVD) is generally identified during evaluation of infertility or as an incidental finding at the time of a surgical procedure. Hypoplasia or aplasia of the vas deferens and seminal vesicles may occur either bilaterally or unilaterally. Testicular development and function and spermatogenesis are usually normal.     CF occurs in 1 in 3,500 live births in those of Mixed European ancestry. About 1 in 25-30 people of Mixed European descent will be a carrier for CF.    Results, Interpretation and Inheritance:  Of note, Ms. Bellmore was born in [place] in 1995 and CF was included on its newborn screen (NBS) in 2006, thirteen years following Ms. Lavalle's birth. Her youngest sibling was also born prior to 2006, and thus, nobody in the family would have been picked up on NBS.    The two genetic changes identified in Ms. Asberry are as follows:  1. F508del, aka c.1521_1523delCTT, or c.1521_1523del or 1653delCTT (p.Phe508del)  2. A455E, aka c.1364C>A (p.Ala455Glu)    The F508del variant accounts for an estimated 30%-80% (depending on the ethnic group) of pathogenic variants. The A455E variant is associated with a milder phenotype, and the literature shows that individuals who are compound heterozygotes with A455E are typically diagnosed at later ages and have chloride concentrations at the sweat test lower than F508del homozygotes [facility] M et al., 1997).    This variant combination, when present in trans (described in further detail below), causes  CF. Patients with CF who have these variants are likely to be pancreatic sufficient. This means they may not need to take oral pancreatic enzyme supplements every day. There are 355 patients with this variant combination in the [employer]. 33% of patients with this variant combination have pancreatic insufficiency. 50% of the patients in the CFTR2 database with this variant combination have had at least one Pseudomona infection (and therefore are infected with the bacteria).    This result means that Ms. 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 The uncertainty lies because at this time, it is unclear whether these two changes in the CFTR gene are on the same allele (in cis) or on opposite alleles (in trans). The genetic counselor at [facility] with whom I spoke confirmed that their assay does not differentiate between cis and trans. Based on the literature, these two genetic variants frequently occur on opposite chromosomes. However, it is not possible to determine from this test whether the two CFTR variants identified in Ms. Griffey are on the same copy of the gene or in different copies of the gene.     If they are on the same allele, then there is a 1/2 (50%) chance of passing on BOTH copies to any of her future children, and a 1/2 (50%) chance that she passes on NONE of the two copies to her children. On the other hand, if these two genetic changes are on opposite chromosomes, there is a 100% chance that each of her children will inherit ONE non-working copy from her and will be carriers. The figure below illustrates this in more detail.        Individuals who have one non-working copy of the CFTR gene are called carriers for the condition. Carriers do not have any symptoms of the condition themselves, as they still have one working copy of the CFTR gene. However, when two carriers decide to have children, there is a 1 in 4 (25%) chance that they could have a child who inherits both non-working copies of the gene and is subsequently affected. Ms. Heit parents are most likely both carriers for the condition, her father especially, due to his brother's diagnosis of CF. This concept is further illustrated in the figure below.      Image retrieved from geneticssupportfoundation.org      To put this into context for Ms. Marbach herself, we will calculate her recurrence risk below. For this scenario, we will assume that Ms. Sorbo's mutations are in trans.      Management:  We will not go into details regarding management as Ms. Hartson is already established with the CF clinic at [facility] and is under their care. Any questions about her treatment/management should be directed to her pulmonary care team.    Next Steps:  We discussed with Ms. Lender that testing of her parents and siblings would help to confirm that the variants identified in her are indeed in trans. Given that she has clinical features of CF, the possibility of them being in cis is low, but not zero. We recommended that Ms. Deike's siblings get tested as soon as possible, as they are at 25% (1 in 4) risk of being affected. Ms. Griscom expressed understanding.    Resources:  For more information about CF, please visit the cystic fibrosis foundation, https://www.everyday-cf.com/ and CF source.     It was a pleasure to participate in Ms. Snipe's care. We  encouraged her to reach out with any questions or concerns.  ___________________________________________________________________________________________________    This patient was discussed with the genetics attending, Darlene Belts, MD, PhD who agrees with the above assessment and plan. The genetic counselor, Darlene Hair, MS, CGC, spent approximately 40 mins in consultation with the patient through one-on-one telephone counseling.       Darlene Hair, MS, [facility]  [facility]  [phone]  ____________________________________________________________________________________________________    Statement regarding telephone/video visit  I spent 40 minutes on the real-time audio and video with the patient. I spent an additional 30 minutes on pre- and post-visit activities.     The patient was physically located in [place] or a state in which I am permitted to provide care. The patient understood that s/he may incur co-pays and cost sharing, and agreed to the telemedicine visit. The visit was completed via phone and/or video, which was appropriate and reasonable under the circumstances given the patient's presentation at the time.    The patient has been advised of the potential risks and limitations of this mode of treatment (including, but not limited to, the absence of in-person examination) and has agreed to be treated using telemedicine. The patient's/patient's family's questions regarding telemedicine have been answered.     If the phone/video visit was completed in an ambulatory setting, the patient has also been advised to contact their provider???s office for worsening conditions, and seek emergency medical treatment and/or call 911 if the patient deems either necessary.

## 2021-05-16 NOTE — Unmapped (Signed)
[facility]  Adult Cystic Fibrosis Clinic        Spoke with Darlene Sutton via phone regarding her need for a portable nebulizer for her upcoming cross country trip. She has spoken with RT, Darlene Sutton, and confirmed she has the info on the specific nebulizer machine needed. Darlene Sutton has completed the [employer] assistance request form and expressed understanding that [employer] would contact her via email regarding her request. Encouraged Darlene Sutton to reach out to this SW with any concerns and sent her a MyChart message with this SW's contact information.

## 2021-05-18 NOTE — Unmapped (Signed)
[facility] Refill Coordination Note    Specialty Medication(s) to be Shipped:   CF/Pulmonary: -Trikafta  Other medication(s) to be shipped: Albuterol HFA      Darlene Sutton, DOB: October 24, 1991  Phone: [phone] (home)     All above HIPAA information was verified with patient.     Was a Nurse, learning disability used for this call? No    Completed refill call assessment today to schedule patient's medication shipment from the [facility] [phone]).  All relevant notes have been reviewed.     Specialty medication(s) and dose(s) confirmed: Regimen is correct and unchanged.   Changes to medications: Shereta reports no changes at this time.  Changes to insurance: No  New side effects reported not previously addressed with a pharmacist or physician: None reported  Questions for the pharmacist: No    Confirmed patient received a Conservation officer, historic buildings and a Surveyor, mining with first shipment. The patient will receive a drug information handout for each medication shipped and additional FDA Medication Guides as required.       DISEASE/MEDICATION-SPECIFIC INFORMATION        For CF patients: CF [facility] Active? Yes, **HWG TX til 11/15/21**    SPECIALTY MEDICATION ADHERENCE     Medication Adherence    Patient reported X missed doses in the last month: 0  Specialty Medication: Trikafta  Patient is on additional specialty medications: No  Patient is on more than two specialty medications: No  Any gaps in refill history greater than 2 weeks in the last 3 months: no  Demonstrates understanding of importance of adherence: yes  Informant: patient  Reliability of informant: reliable  Reasons for non-adherence: no problems identified  Confirmed plan for next specialty medication refill: delivery by pharmacy  Refills needed for supportive medications: not needed        Were doses missed due to medication being on hold? No    Trikafta: 14 days of medicine on hand     REFERRAL TO PHARMACIST Referral to the pharmacist: Not needed    [facility]     Shipping address confirmed in Epic.     Delivery Scheduled: Yes, Expected medication delivery date: 05/26/2021.     Medication will be delivered via UPS to the prescription address in Epic WAM.    Darlene Sutton D Kallee Nam   [facility] Specialty Technician

## 2021-05-20 NOTE — Unmapped (Signed)
Created in error

## 2021-05-25 MED FILL — ALBUTEROL SULFATE HFA 90 MCG/ACTUATION AEROSOL INHALER: 25 days supply | Qty: 8.5 | Fill #3

## 2021-05-25 MED FILL — TRIKAFTA 100-50-75 MG (D)/150 MG (N) TABLETS: 28 days supply | Qty: 84 | Fill #1

## 2021-06-01 IMAGING — MR MR ABDOMEN WO/W CM MRCP
19 of 22 series · 44 of 48 positions shown · IV contrast (gadavist)
Comparison: None.

CLINICAL DATA: Recent acute pancreatitis.

EXAM:
MRI ABDOMEN WITHOUT AND WITH CONTRAST (INCLUDING MRCP)
TECHNIQUE: Multiplanar multisequence MR imaging of the abdomen was performed
both before and after the administration of intravenous contrast.
Heavily T2-weighted images of the biliary and pancreatic ducts were
obtained, and three-dimensional MRCP images were rendered by post
processing.
CONTRAST:  7mL GADAVIST GADOBUTROL 1 MMOL/ML IV SOLN

[Series 3: T2 fat-sat · axial · 6.0mm · 1.14mm/px · 1 of 36 slices shown]
[im 1/36]
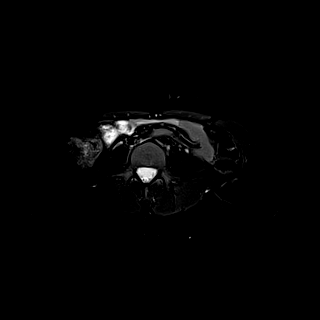

[Series 5: T2 · coronal · 6.0mm · 1.48mm/px · 1 of 28 slices shown (1 of 2)]
[im 1/28]
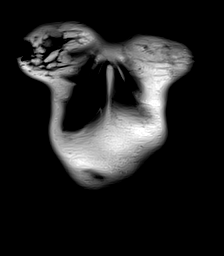

[Series 6: DWI · axial · 6.0mm · 1.49mm/px · z∈[-144,+108]mm · 3 of 72 slices shown (1 of 2)]
[im 1/72]
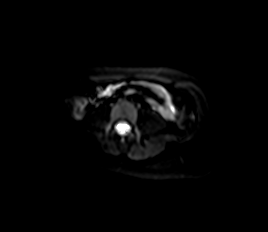
[im 36/72]
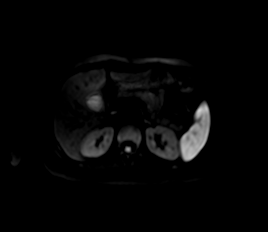
[im 72/72]
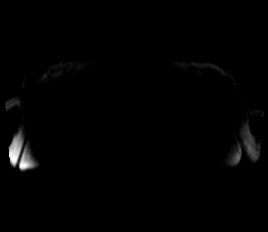

[Series 7: DWI · axial · 6.0mm · 1.49mm/px · 1 of 36 slices shown (2 of 2)]
[im 1/36]
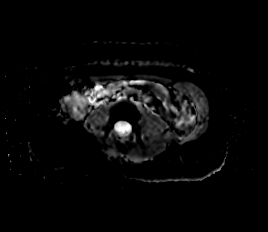

[Series 8: T1 · axial · 3.0mm · 1.12mm/px · z∈[-155,+106]mm · 3 of 88 slices shown (1 of 2)]
[im 1/88]
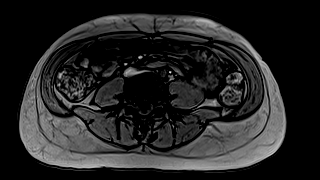
[im 44/88]
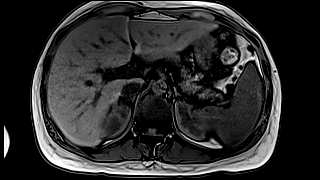
[im 88/88]
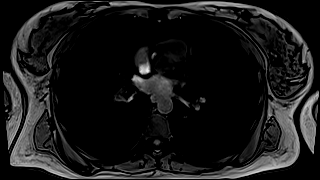

[Series 9: T1 · axial · 3.0mm · 1.12mm/px · z∈[-155,+106]mm · 3 of 88 slices shown (2 of 2)]
[im 1/88]
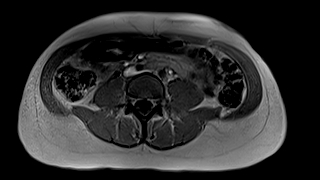
[im 44/88]
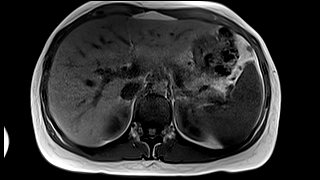
[im 88/88]
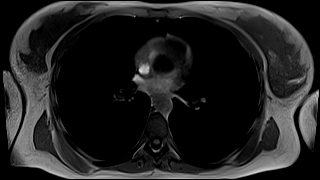

[Series 11: cor obl thk · sagittal · 50.0mm · 0.78mm/px · 1 of 9 slices shown]
[im 1/9]
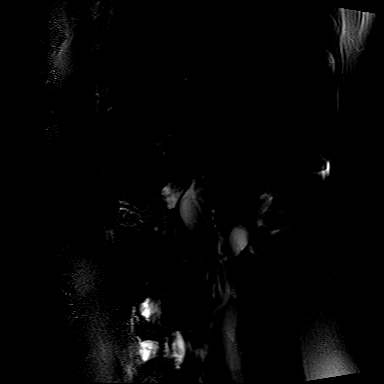

[Series 13: cor_3d_spc_trig · coronal · 1.0mm · 0.49mm/px · 3 of 80 slices shown]
[im 1/80]
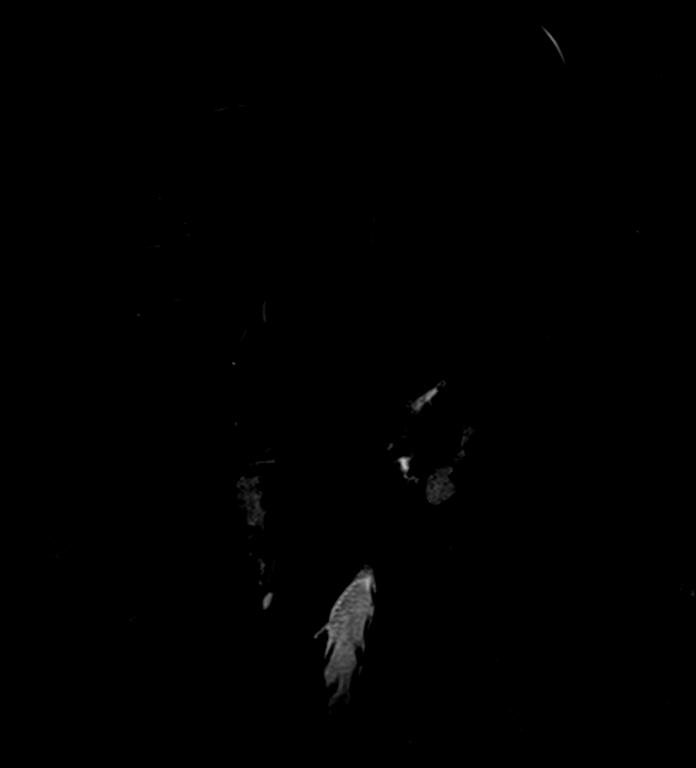
[im 40/80]
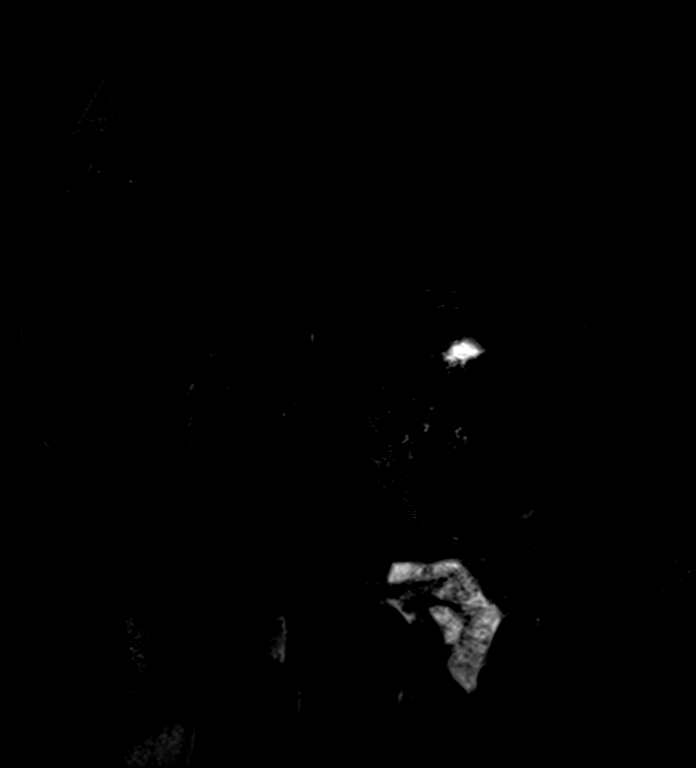
[im 80/80]
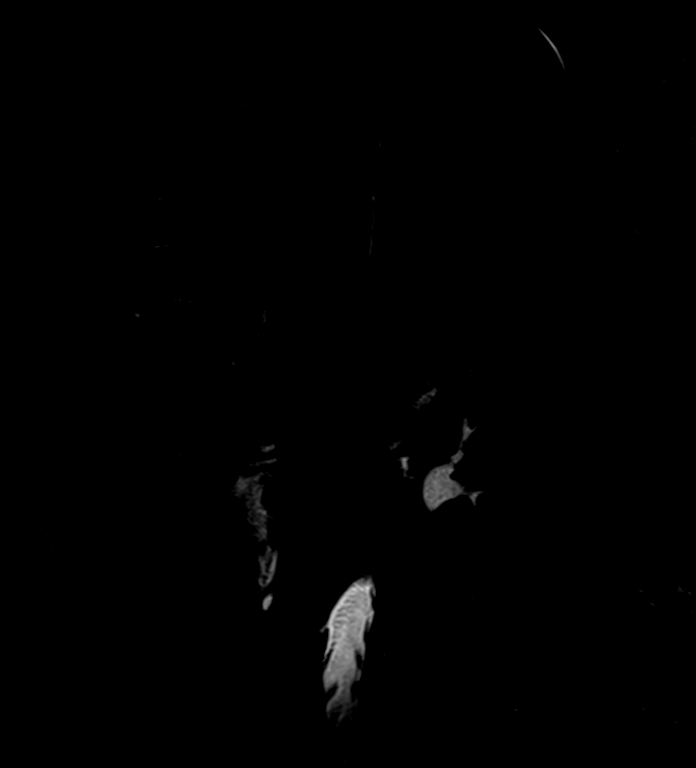

[Series 15: T2 · axial · 6.0mm · 1.41mm/px · 1 of 40 slices shown (2 of 2)]
[im 1/40]
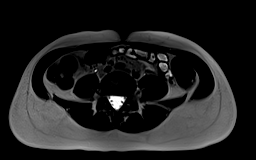

[Series 17: T1 dynamic · axial · 3.0mm · 1.12mm/px · z∈[-155,+106]mm · 3 of 88 slices shown (1 of 6)]
[im 1/88]
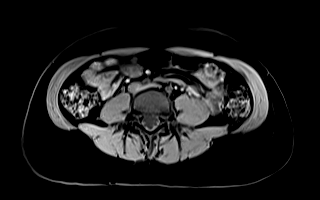
[im 44/88]
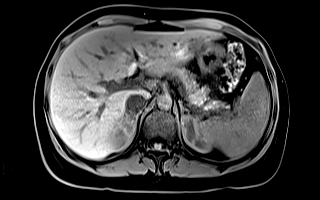
[im 88/88]
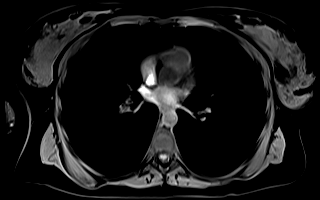

[Series 21: T1 dynamic · axial · 3.0mm · 1.12mm/px · z∈[-155,+106]mm · 3 of 88 slices shown (2 of 6)]
[im 1/88]
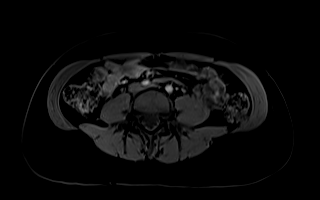
[im 44/88]
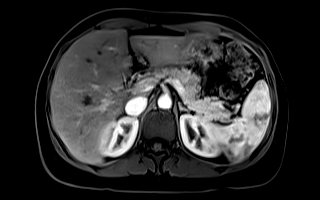
[im 88/88]
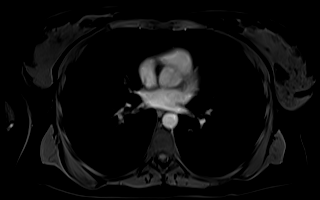

[Series 23: T1 dynamic · axial · 3.0mm · 1.12mm/px · z∈[-155,+106]mm · 3 of 88 slices shown (3 of 6)]
[im 1/88]
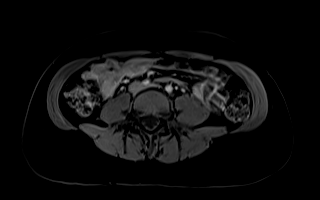
[im 44/88]
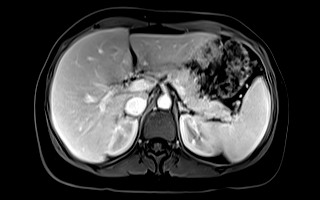
[im 88/88]
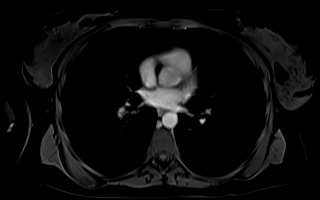

[Series 25: T1 dynamic · axial · 3.0mm · 1.12mm/px · z∈[-155,+106]mm · 3 of 88 slices shown (4 of 6)]
[im 1/88]
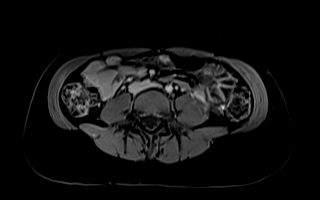
[im 44/88]
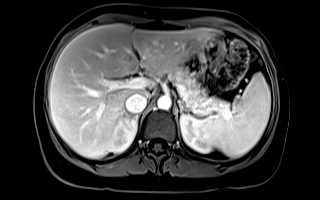
[im 88/88]
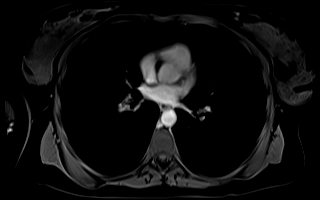

[Series 27: T1 dynamic · coronal · 5.0mm · 1.41mm/px · 1 of 40 slices shown (5 of 6)]
[im 1/40]
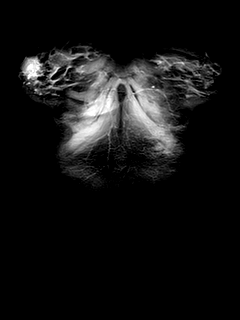

[Series 29: T1 dynamic · axial · 3.0mm · 1.12mm/px · z∈[-155,+106]mm · 3 of 88 slices shown (6 of 6)]
[im 1/88]
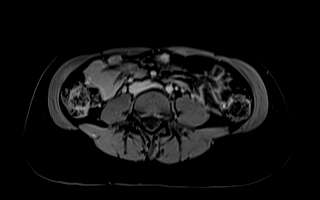
[im 44/88]
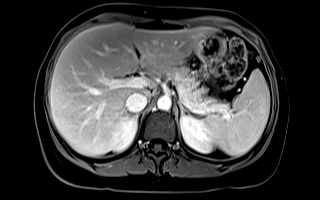
[im 88/88]
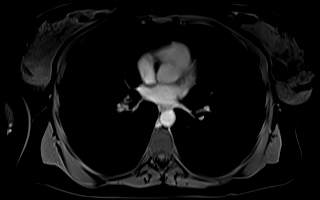

[Series 102: sub_20 sec · axial · 3.0mm · 1.12mm/px · z∈[-155,+106]mm · 3 of 88 slices shown]
[im 1/88]
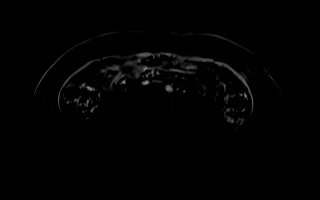
[im 44/88]
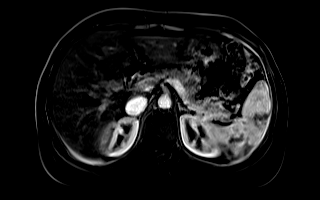
[im 88/88]
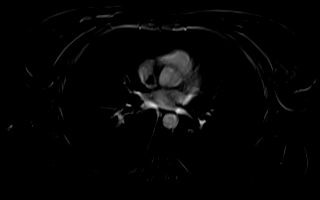

[Series 103: sub_45 sec · axial · 3.0mm · 1.12mm/px · z∈[-155,+106]mm · 3 of 88 slices shown]
[im 1/88]
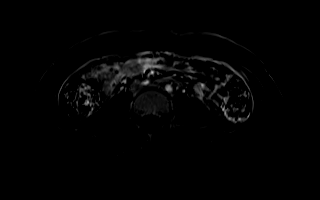
[im 44/88]
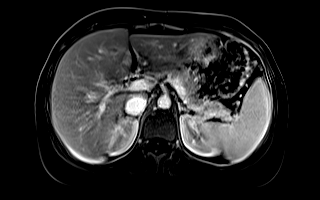
[im 88/88]
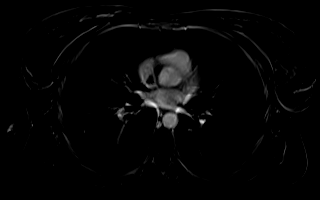

[Series 104: sub_90 sec · axial · 3.0mm · 1.12mm/px · z∈[-155,+106]mm · 3 of 88 slices shown]
[im 1/88]
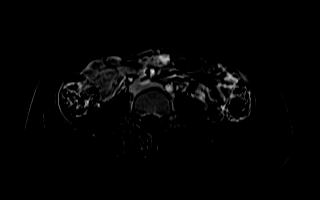
[im 44/88]
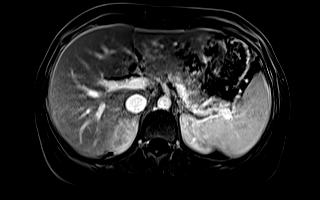
[im 88/88]
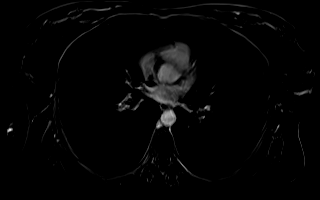

[Series 105: sub_delay · axial · 3.0mm · 1.12mm/px · z∈[-92,+106]mm · 2 of 67 slices shown]
[im 1/67]
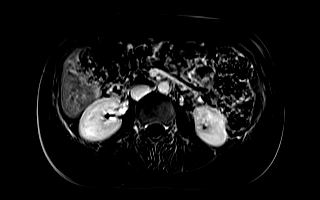
[im 67/67]
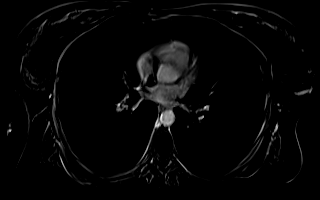

[44 of 48 positions shown; findings below may reference images not displayed]

FINDINGS: Lower chest:  Lung bases are clear.

Hepatobiliary: Normal hepatic parenchyma. No evidence of hepatic
steatosis. No focal hepatic lesion. Gallbladder normal. No
cholelithiasis. Common bile duct normal caliber.

Pancreas: Somewhat indistinct smooth appearance of the pancreatic
head and neck (image 55/25). The pancreatic body and tail have the
normal serrated margin. No evidence of inflammation in the pancreas.
No peripancreatic inflammation with the retroperitoneum. The
pancreatic duct is not demonstrated. No abnormal enhancement.

Spleen: Normal spleen.

Adrenals/urinary tract: Adrenal glands and kidneys are normal.

Stomach/Bowel: Stomach and limited of the small bowel is
unremarkable

Vascular/Lymphatic: Abdominal aortic normal caliber. No
retroperitoneal periportal lymphadenopathy.

Musculoskeletal: No aggressive osseous lesion
IMPRESSION: 1. No specific pancreatic abnormality to determine etiology of
pancreatitis. Subtle indistinct margin through the neck and head of
the pancreas. Pancreatic duct is not identified.
2. No secondary signs of autoimmune pancreatitis.
3. Normal common bile duct.  No cholelithiasis.
4. No evidence of acute pancreatic inflammation.

## 2021-06-01 IMAGING — MR MR 3D RECON AT [facility]
18 of 21 series · 44 of 48 positions shown · IV contrast (gadavist)
Comparison: None.

CLINICAL DATA: Recent acute pancreatitis.

EXAM:
MRI ABDOMEN WITHOUT AND WITH CONTRAST (INCLUDING MRCP)
TECHNIQUE: Multiplanar multisequence MR imaging of the abdomen was performed
both before and after the administration of intravenous contrast.
Heavily T2-weighted images of the biliary and pancreatic ducts were
obtained, and three-dimensional MRCP images were rendered by post
processing.
CONTRAST:  7mL GADAVIST GADOBUTROL 1 MMOL/ML IV SOLN

[Series 3: T2 fat-sat · axial · 6.0mm · 1.14mm/px · z∈[-146,+106]mm · 2 of 36 slices shown]
[im 1/36]
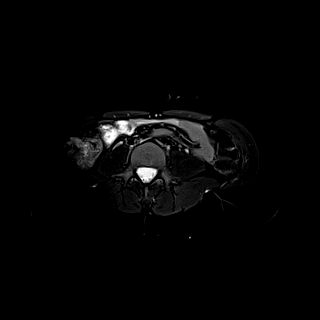
[im 36/36]
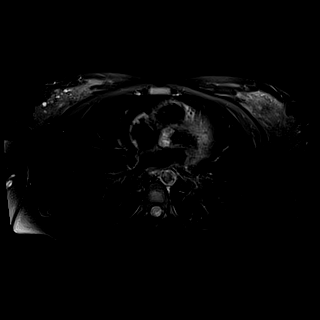

[Series 5: T2 · coronal · 6.0mm · 1.48mm/px · 2 of 28 slices shown (1 of 2)]
[im 1/28]
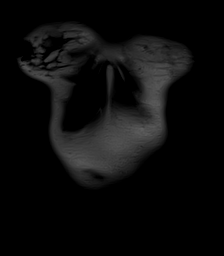
[im 28/28]
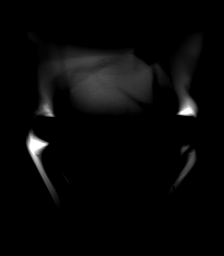

[Series 6: DWI · axial · 6.0mm · 1.49mm/px · z∈[-144,+108]mm · 3 of 72 slices shown (1 of 2)]
[im 1/72]
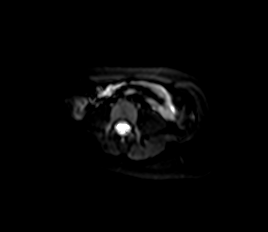
[im 36/72]
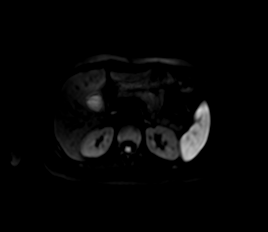
[im 72/72]
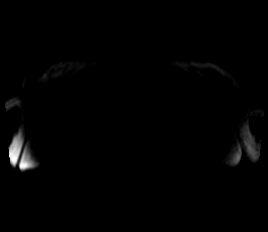

[Series 7: DWI · axial · 6.0mm · 1.49mm/px · 1 of 36 slices shown (2 of 2)]
[im 1/36]
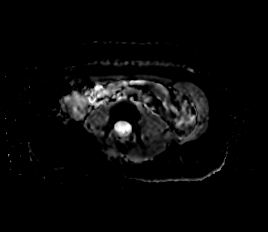

[Series 8: T1 · axial · 3.0mm · 1.12mm/px · z∈[-155,+106]mm · 3 of 88 slices shown (1 of 2)]
[im 1/88]
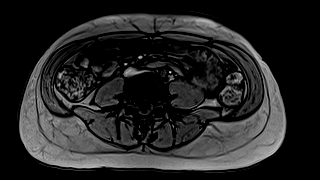
[im 44/88]
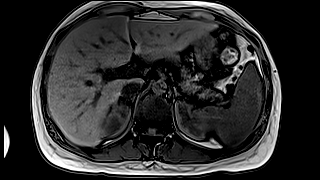
[im 88/88]
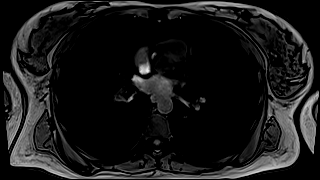

[Series 9: T1 · axial · 3.0mm · 1.12mm/px · z∈[-155,+106]mm · 3 of 88 slices shown (2 of 2)]
[im 1/88]
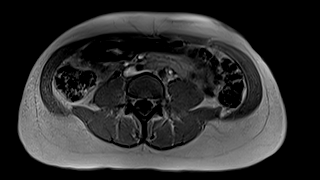
[im 44/88]
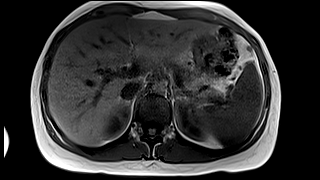
[im 88/88]
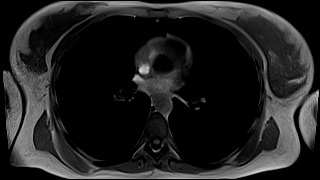

[Series 11: cor obl thk · sagittal · 50.0mm · 0.78mm/px · 1 of 9 slices shown]
[im 1/9]
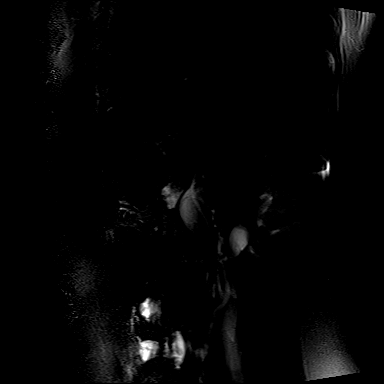

[Series 13: cor_3d_spc_trig · coronal · 1.0mm · 0.49mm/px · 3 of 80 slices shown]
[im 1/80]
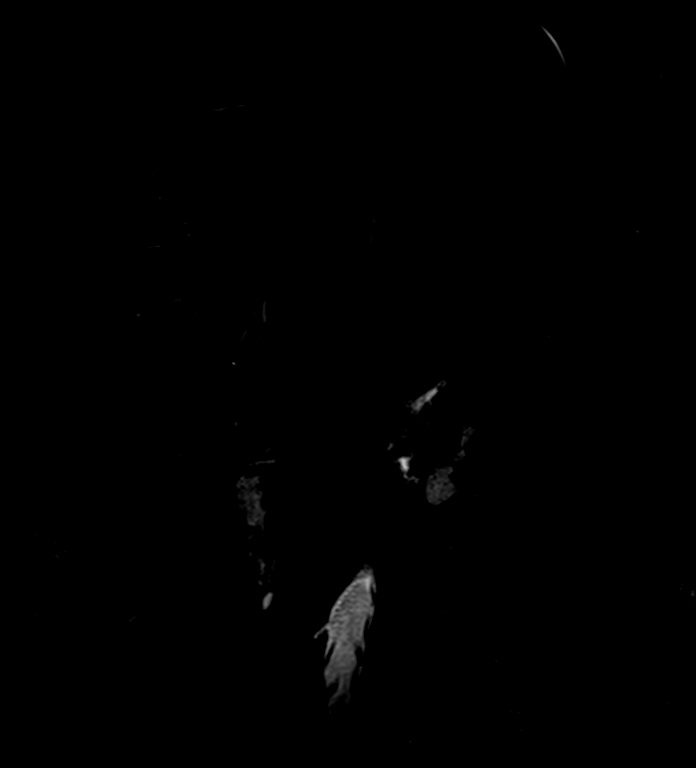
[im 40/80]
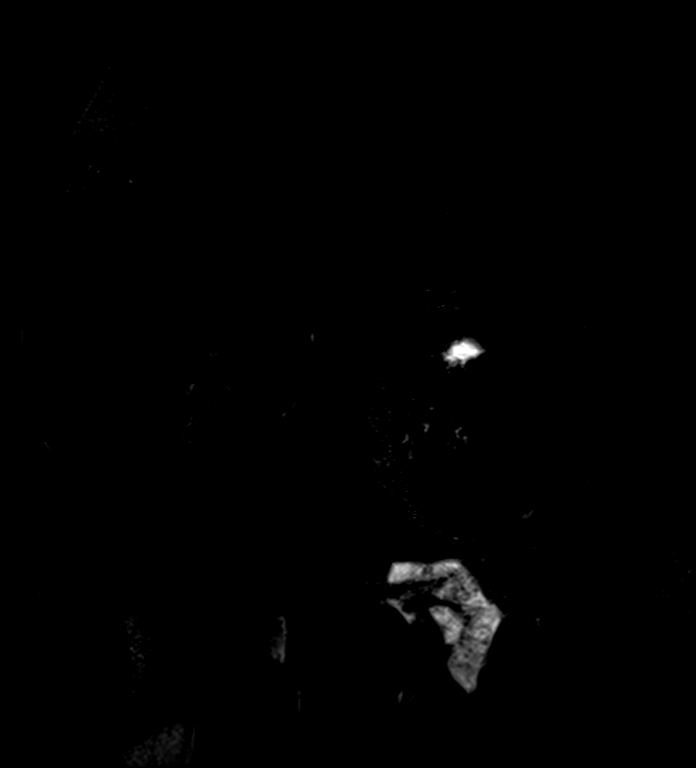
[im 80/80]
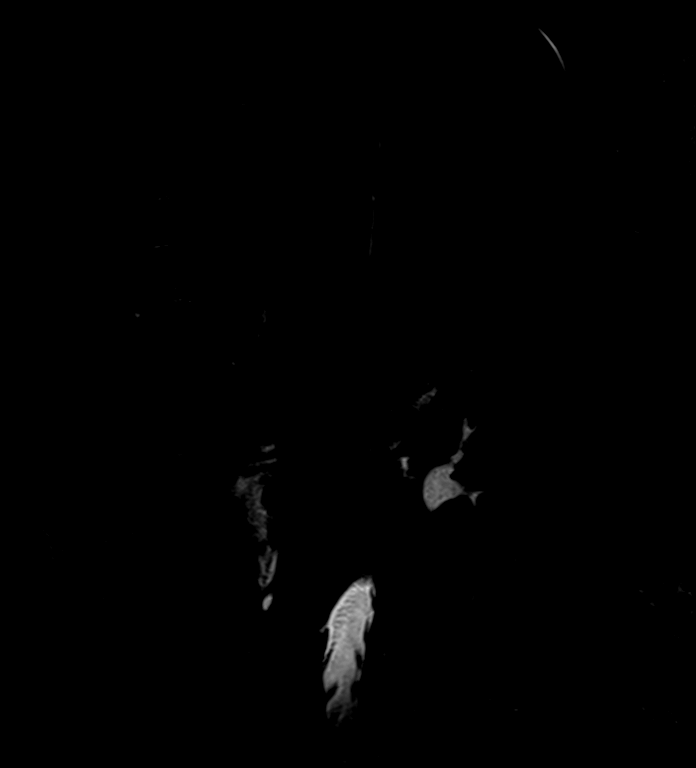

[Series 15: T2 · axial · 6.0mm · 1.41mm/px · 1 of 40 slices shown (2 of 2)]
[im 1/40]
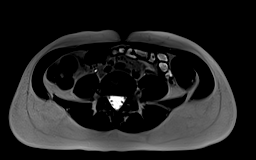

[Series 17: T1 dynamic · axial · 3.0mm · 1.12mm/px · z∈[-155,+106]mm · 3 of 88 slices shown (1 of 6)]
[im 1/88]
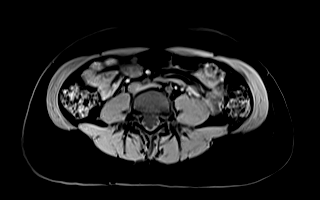
[im 44/88]
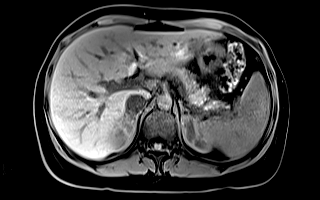
[im 88/88]
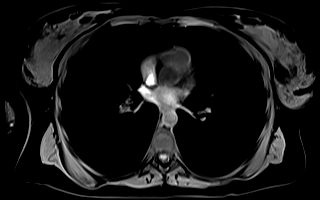

[Series 21: T1 dynamic · axial · 3.0mm · 1.12mm/px · z∈[-155,+106]mm · 3 of 88 slices shown (2 of 6)]
[im 1/88]
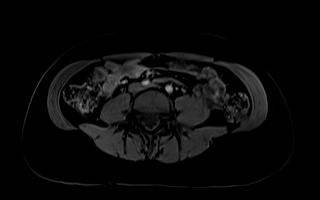
[im 44/88]
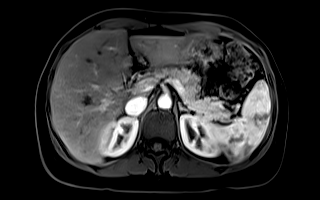
[im 88/88]
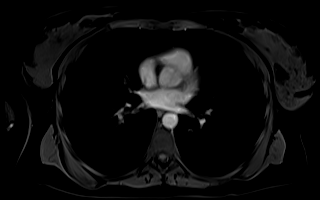

[Series 23: T1 dynamic · axial · 3.0mm · 1.12mm/px · z∈[-155,+106]mm · 3 of 88 slices shown (3 of 6)]
[im 1/88]
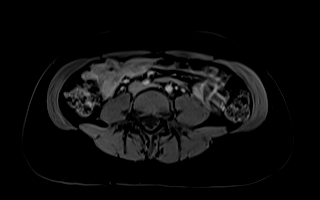
[im 44/88]
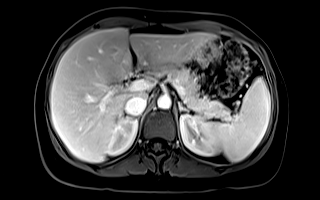
[im 88/88]
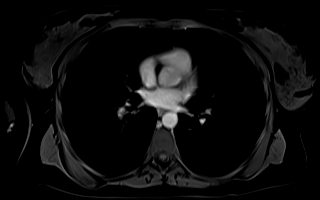

[Series 25: T1 dynamic · axial · 3.0mm · 1.12mm/px · z∈[-155,+106]mm · 3 of 88 slices shown (4 of 6)]
[im 1/88]
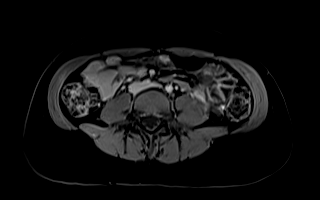
[im 44/88]
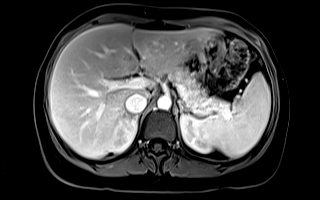
[im 88/88]
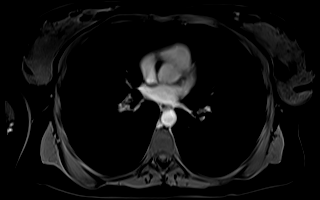

[Series 27: T1 dynamic · coronal · 5.0mm · 1.41mm/px · 1 of 40 slices shown (5 of 6)]
[im 1/40]
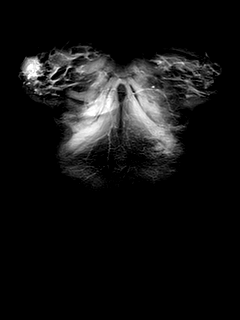

[Series 29: T1 dynamic · axial · 3.0mm · 1.12mm/px · z∈[-155,+106]mm · 3 of 88 slices shown (6 of 6)]
[im 1/88]
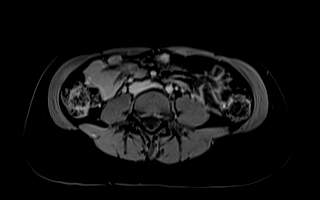
[im 44/88]
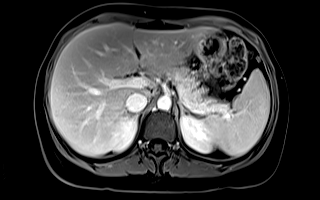
[im 88/88]
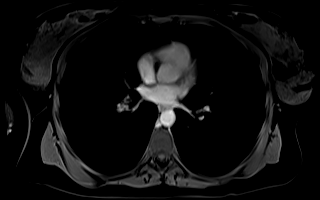

[Series 102: sub_20 sec · axial · 3.0mm · 1.12mm/px · z∈[-155,+106]mm · 3 of 88 slices shown]
[im 1/88]
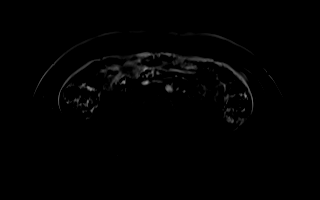
[im 44/88]
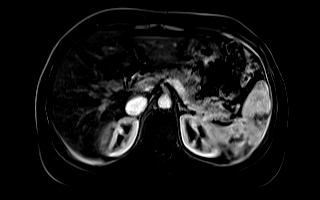
[im 88/88]
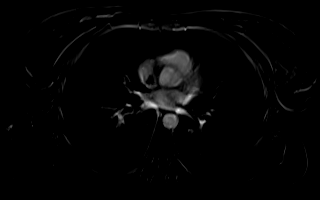

[Series 103: sub_45 sec · axial · 3.0mm · 1.12mm/px · z∈[-155,+106]mm · 3 of 88 slices shown]
[im 1/88]
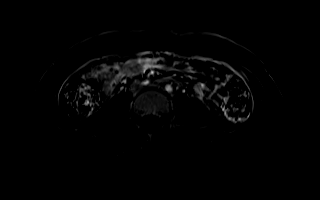
[im 44/88]
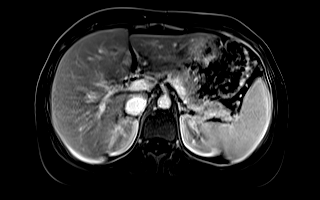
[im 88/88]
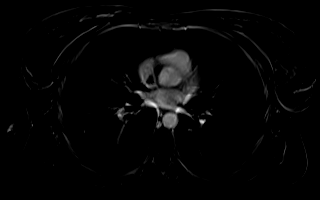

[Series 104: sub_90 sec · axial · 3.0mm · 1.12mm/px · z∈[-155,+106]mm · 3 of 88 slices shown]
[im 1/88]
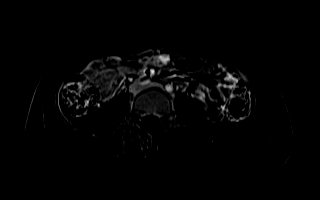
[im 44/88]
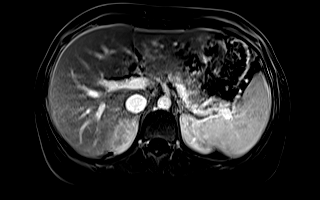
[im 88/88]
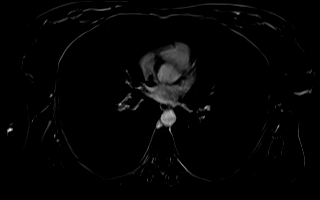

[44 of 48 positions shown; findings below may reference images not displayed]

FINDINGS: Lower chest:  Lung bases are clear.

Hepatobiliary: Normal hepatic parenchyma. No evidence of hepatic
steatosis. No focal hepatic lesion. Gallbladder normal. No
cholelithiasis. Common bile duct normal caliber.

Pancreas: Somewhat indistinct smooth appearance of the pancreatic
head and neck (image 55/25). The pancreatic body and tail have the
normal serrated margin. No evidence of inflammation in the pancreas.
No peripancreatic inflammation with the retroperitoneum. The
pancreatic duct is not demonstrated. No abnormal enhancement.

Spleen: Normal spleen.

Adrenals/urinary tract: Adrenal glands and kidneys are normal.

Stomach/Bowel: Stomach and limited of the small bowel is
unremarkable

Vascular/Lymphatic: Abdominal aortic normal caliber. No
retroperitoneal periportal lymphadenopathy.

Musculoskeletal: No aggressive osseous lesion
IMPRESSION: 1. No specific pancreatic abnormality to determine etiology of
pancreatitis. Subtle indistinct margin through the neck and head of
the pancreas. Pancreatic duct is not identified.
2. No secondary signs of autoimmune pancreatitis.
3. Normal common bile duct.  No cholelithiasis.
4. No evidence of acute pancreatic inflammation.

## 2021-06-01 NOTE — Unmapped (Signed)
[facility]    Home Spirometry Monitoring      05/31/21        I have reviewed 1 home spirometry test(s) over prior calendar month.  Most recent test is normal.  Compared to prior home spirometry values, the most recent test is worse. Best in-clinic FEV1 in last year was 3.75 L (108%) on 03/17/21.         Quality of most recent test graded D.     Follow-up plan:   ??? Reached out to patient via MyChart.  ??? Continue monthly home spirometry monitoring for the management of cystic fibrosis.    Durenda Hurt [place], PA-C  [facility]   [phone]

## 2021-06-15 NOTE — Unmapped (Signed)
[facility] Refill Coordination Note    Specialty Medication(s) to be Shipped:   CF/Pulmonary: -Trikafta  Other medication(s) to be shipped: Albuterol HFA     Darlene Sutton, DOB: 12/19/1991  Phone: [phone] (home)     All above HIPAA information was verified with patient.     Was a Nurse, learning disability used for this call? No    Completed refill call assessment today to schedule patient's medication shipment from the [facility] [phone]).  All relevant notes have been reviewed.     Specialty medication(s) and dose(s) confirmed: Regimen is correct and unchanged.   Changes to medications: [name] reports no changes at this time.  Changes to insurance: No  New side effects reported not previously addressed with a pharmacist or physician: None reported  Questions for the pharmacist: No    Confirmed patient received a Conservation officer, historic buildings and a Surveyor, mining with first shipment. The patient will receive a drug information handout for each medication shipped and additional FDA Medication Guides as required.       DISEASE/MEDICATION-SPECIFIC INFORMATION        For CF patients: CF [facility] Active? Yes, **HWG TX til 11/15/21**    SPECIALTY MEDICATION ADHERENCE     Medication Adherence    Patient reported X missed doses in the last month: 0  Specialty Medication: Trikafta  Patient is on additional specialty medications: No  Patient is on more than two specialty medications: No  Informant: patient  Reliability of informant: reliable  Reasons for non-adherence: no problems identified  Confirmed plan for next specialty medication refill: delivery by pharmacy  Refills needed for supportive medications: not needed        Were doses missed due to medication being on hold? No    Trikafta: 7-10 days of medicine on hand     REFERRAL TO PHARMACIST     Referral to the pharmacist: Not needed      [facility]     Shipping address confirmed in Epic.     Delivery Scheduled: Yes, Expected medication delivery date: 06/21/2021.     Medication will be delivered via UPS to the temporary address in Epic WAM.    [name] P Wetzel [place] [facility] Specialty Technician

## 2021-06-16 MED FILL — ALBUTEROL SULFATE HFA 90 MCG/ACTUATION AEROSOL INHALER: 25 days supply | Qty: 8.5 | Fill #4

## 2021-06-16 MED FILL — TRIKAFTA 100-50-75 MG (D)/150 MG (N) TABLETS: 28 days supply | Qty: 84 | Fill #2

## 2021-06-30 ENCOUNTER — Ambulatory Visit: Admit: 2021-06-30 | Discharge: 2021-07-01 | Payer: PRIVATE HEALTH INSURANCE

## 2021-06-30 DIAGNOSIS — K5900 Constipation, unspecified: Principal | ICD-10-CM

## 2021-06-30 DIAGNOSIS — J4520 Mild intermittent asthma, uncomplicated: Principal | ICD-10-CM

## 2021-06-30 LAB — HEPATIC FUNCTION PANEL
ALBUMIN: 3.8 g/dL (ref 3.4–5.0)
ALKALINE PHOSPHATASE: 48 U/L (ref 46–116)
ALT (SGPT): 24 U/L (ref 10–49)
AST (SGOT): 27 U/L (ref <=34)
BILIRUBIN DIRECT: 0.2 mg/dL (ref 0.00–0.30)
BILIRUBIN TOTAL: 0.5 mg/dL (ref 0.3–1.2)
PROTEIN TOTAL: 7.2 g/dL (ref 5.7–8.2)

## 2021-06-30 LAB — LIPASE: LIPASE: 28 U/L (ref 12–53)

## 2021-06-30 NOTE — Unmapped (Signed)
Pulmonary Cystic Fibrosis Clinic Note    ASSESSMENT     Darlene Sutton is a 30 y.o. female with cystic fibrosis who presents to the [facility] for a follow-up visit. 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 She had a CT Chest that showed evidence of bronchiectasis. She established care with our clinic in 11/2020 and has since started Trikafta, as well as Hypertonic Saline 3% daily with the Aerobika for airway clearance. She recently returned from a 23-day [place] trip and did very well while away. Today, her FEV1 is stable at 109%. Her mid-flows are increased to 125% and this may be related to less allergies or increased exercise.     Problem List Items Addressed This Visit        Respiratory    Cystic fibrosis (CMS-HCC) - Primary    Relevant Orders    Hepatic Function Panel    Lipase Level    Dexa Bone Density Skeletal    Mild intermittent asthma       Other    Constipation          PLAN     1) Collected CF sputum culture via OP swab - unable to expectorate any sputum   2) Continue Albuterol MDI, then Hypertonic Saline 3% + Aerobika once daily for airway clearance given bronchiectasis; she may use Albuterol PRN asthma symptoms   3) Continue Trikafta - monitor LFTs q42months for the first year, due today   4) Continue Claritin/Flonase for seasonal/environmental allergies   5) Continue monthly/PRN home spirometry monitoring  6) For her constipation, continue Citrucel half dose daily and Miralax 1 dose every other day  7) Plan for DEXA scan to screen for osteopenia/osteoporosis next visit   8) Patient has received 3 COVID-19 vaccines - recommend Flu vaccine next visit   9) She recently had an appointment with genetic counseling - her younger brother has been undergoing testing for CF   10) She is established with a local PCP for women's health     The patient will return to clinic in 3 months, or sooner if clinically indicated.    Durenda Hurt [place], PA-C  [facility]   [phone]    SUBJECTIVE:        Darlene Sutton is a 30 y.o. year old female with cystic fibrosis who comes in today for a routine CF follow-up visit. 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 She established care with our CF Clinic in 11/2020. 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 She used her Hypertonic Saline nebulizer intermittently with the portable nebulizer. When they got to the [facility], she noticed an increased cough so she used it for 3 days and it helped. She used Albuterol 2 puffs twice daily every day. She took Claritin and Flonase every day. She took Trikafta every day. She reports recently having slightly more sputum production. She denies chest tightness, wheezing or shortness of breath. She denies any sinus congestion, drainage or pressure.     She did not take Citrucel or Miralax while on the trip and did fine.  At the beginning of the trip, she had a BM every day. Toward the end, she did notice that things slowed down a bit, but she took Miralax twice since coming home and it has helped. Prior to her trip, she was taking Citrucel 1/2 dose daily and Miralax every other day. This kept her regular. She plans to resume that regimen. She was able to eat some more things on her trip, like hot dogs and ground beef, and she tolerated it well. She has not had any abdominal pain lately.     She had a genetic counseling appointment recently. Her younger brother who has asthma was seen by the [facility] clinic in [place], Dr. Dorise Hiss. They did a sweat test that showed a result of 41. He had a genetic panel which is pending.     She lives alone in [place]. 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 She is off for the Summer and goes back in August. She plans to go to [place] May for 2 weeks soon. She is not currently dating.     CF Overview:     Genetics: F508del / A455E   CF Modulator Tx: Trikafta - started late January 2022    Airway Clearance Regimen: Waymon Budge       Exercise Habits: Walking     Typical bacteria: MSSA     Last CF sputum culture: 03/17/21  Last AFB culture: 12/16/20    Inhaled ABX: None     Last oral antibiotics: Z-pack (date unknown)   Last IV antibiotics: Never     The patient judges that exacerbations requiring any antibiotic intervention (oral, inhaled, IV) are occurring about <1 times per year.    Hemoptysis: no  ABPA:  no  Ptx:  no  O2 needs:      no  Port:  no  Sinus symptoms (congestion, rhinorrhea, pain) are not present and this is felt to be better than last visit.     Panc Insuf: No (fecal elastase normal at 378 on 12/28/20)   PEG:  no  Supplements: no  DIOS:  no  CF Liver Dz:   no  Patient is having approximately 1 stool daily.  Stools are described as formed in appearance.     Last Colonoscopy: None (not due until age 59 for screening)    Diabetes: no.   Last OGTT: none; (Fasting n/a; 2 hour n/a)   HgbA1C: 12/16/20 (5.0%)    Osteopenia: no   Last DEXA: None     Depression: no  Anxiety:  no  Adherence to medications and airway clearance is rated as Excellent.      Medications:  Reviewed with patient and updated in EPIC.  Outpatient Encounter Medications as of 06/30/2021   Medication Sig Dispense Refill   ??? albuterol 2.5 mg /3 mL (0.083 %) nebulizer solution Inhale 1 vial (2.5 mg total) by nebulization daily before hypertonic saline nebs. May also inhale 1 vial (2.5 mg total) every six (6) hours as needed for shortness of breath/wheezing. 300 mL 11   ??? albuterol HFA 90 mcg/actuation inhaler Inhale 2 puffs daily prior to hypertonic saline nebs. May also inhale 2 puffs every six (6) hours as needed for shortness of breath/wheezing. 8.5 g 11   ??? elexacaftor-tezacaftor-ivacaft (TRIKAFTA) 100-50-75 mg(d) /150 mg (n) tablet Take 2 Tablets (Elexacaftor 100mg /Tezacaftor 50mg /Ivacaftor 75mg ) by mouth in the morning and 1 tablet (ivacaftor 150mg ) in the evening with fatty food 84 tablet 3   ???  fluticasone propionate (FLONASE) 50 mcg/actuation nasal spray 1 spray into each nostril two (2) times a day. 16 g 11   ??? loratadine (CLARITIN) 10 mg tablet Take 10 mg by mouth daily as needed.     ??? sodium chloride 3.5 % Nebu Inhale 1 ampule by nebulization once daily. 240 mL 5   ??? [DISCONTINUED] psyllium husk 0.4 gram cap Take by mouth daily as needed.     ??? methylcellulose (CITRUCEL, SUCROSE,) oral powder        No facility-administered encounter medications on file as of 06/30/2021.       Allergies:  No Known Allergies    Past Medical History:  Past Medical History:   Diagnosis Date   ??? Asthma    ??? 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 Pancreatitis, acute        Past Surgical History:  No past surgical history on file.    Social History:  Social History     Socioeconomic History   ??? Marital status: Single     Spouse name: None   ??? Number of children: None   ??? Years of education: None   ??? Highest education level: None   Occupational History   ??? Occupation: Runner, broadcasting/film/video / Therapist, sports: [employer]   Tobacco Use   ??? Smoking status: Never Smoker   ??? Smokeless tobacco: Never Used       Family History:  Family History   Problem Relation Age of Onset   ??? No Known Problems Mother    ??? No Known Problems Father    ??? No Known Problems Sister    ??? No Known Problems Sister    ??? No Known Problems Brother    ??? Asthma Brother    ??? Cystic fibrosis Paternal Uncle        Review of Systems:  A 12 point review of systems was negative except for pertinent items noted in the HPI.        OBJECTIVE:   Objective   Physical exam:   Vitals:    06/30/21 0816   BP: 98/73   BP Site: L Arm BP Position: Sitting   BP Cuff Size: Medium   Pulse: 86   Temp: 36.1 ??C (97 ??F)   TempSrc: Temporal   SpO2: 99%   Weight: 66.7 kg (147 lb)   Height: 167.5 cm (5' 5.95)     Body mass index is 23.77 kg/m??.  Wt Readings from Last 3 Encounters:   06/30/21 66.7 kg (147 lb)   12/16/20 65.9 kg (145 lb 3.2 oz)       GEN: Cooperative female, sitting up on exam table, NAD  HEAD: Normocephalic, atraumatic  EYES: PERRLA, anicteric sclerae, conjuctiva clear  NOSE: Nares and mucosa normal with midline septum, no drainage or sinus tenderness.  OROPHARYNX: Pink and moist without erythema or exudate  NECK: Supple, trachea midline  HEART/CV: RRR, S1, S2 nl, no MRG  LUNGS: CTA bilaterally, no crackles or wheezes, normal WOB on RA  ABD: NABS, soft, NT/ND, no rebound or guarding, no masses, no hepatomegaly noted   EXT: No cyanosis, clubbing, or edema  SKIN: No rashes or lesions noted  NEURO: No focal deficits noted  PSYCH: Awake, alert, and interactive. Mood and affect appropriate.     Pulmonary Function Testing Results:  06/30/21        Interpretation: Spirometry is  normal and unchanged from last visit.     Performed on 12/06/20 at [facility].   Results per CareEverywhere note:   FVC-Pre L 4.01   FVC-Predicted Pre % 99   FVC-Post L 3.97   FVC-Predicted Post % 98   Pre FEV1/FVC % % 83   Post FEV1/FCV % % 85   FEV1-Pre L 3.32   FEV1-Predicted Pre % 97   FEV1-Post L 3.39   DLCO uncorrected ml/min/mmHg 25.04   DLCO [place]% % 104   DLCO corrected ml/min/mmHg 25.04   DLCO COR %Predicted % 104   DLVA Predicted % 108   TLC L 5.95   TLC % Predicted % 111   RV % Predicted % 140     I reviewed interval medical records.    CF Monitoring:     Pertinent Laboratory Data:  CBC  Lab Results   Component Value Date    WBC 3.5 04/15/2021    HGB 13.8 04/15/2021    HCT 39.6 04/15/2021    PLT 276 04/15/2021       ELECTROLYTES  Lab Results   Component Value Date    Sodium 138 12/16/2020     Lab Results   Component Value Date    Potassium 3.5 12/16/2020     Lab Results   Component Value Date    Chloride 104 12/16/2020     Lab Results   Component Value Date    CO2 24.6 12/16/2020     Lab Results   Component Value Date    BUN 11 12/16/2020     Lab Results   Component Value Date    Creatinine 0.61 12/16/2020     Lab Results   Component Value Date    Glucose 101 12/16/2020     Lab Results   Component Value Date    Calcium 10.2 12/16/2020     Lab Results   Component Value Date    Anion Gap 9 12/16/2020       GI  Lab Results   Component Value Date    AST 42 (H) 04/15/2021     Lab Results   Component Value Date    ALT 46 (H) 04/15/2021     Lab Results   Component Value Date    Alkaline Phosphatase 78 04/15/2021     Lab Results   Component Value Date    Total Protein 7.2 04/15/2021     Lab Results   Component Value Date    Albumin 4.4 12/16/2020     Lab Results   Component Value Date    GGT <7 12/16/2020     Lab Results   Component Value Date    Total Bilirubin 0.3 04/15/2021       VITAMIN LEVELS / INR:    Lab Results   Component Value Date    Vitamin A 36.4 12/16/2020     No results found for: VITD2  No results found for: VITD3  Lab Results   Component Value Date    Vitamin D Total (25OH) 32.6 12/16/2020     Lab Results   Component Value Date    Vitamin E 7.3 12/16/2020       Lab Results   Component Value Date    PT 11.7 12/16/2020     Lab Results   Component Value Date    INR 1.00 12/16/2020       IGE  Lab Results   Component Value Date    IgE, Total 1,041 (  H) 12/16/2020       SPUTUM CULTURES  CF Sputum Culture (no units)   Date Value   03/17/2021 4+ Oropharyngeal Flora Isolated   03/17/2021 <1+ Staphylococcus aureus (A)     AFB Smear (no units)   Date Value   12/16/2020     NO ACID FAST BACILLI SEEN- 3 negative smears do not exclude pulmonary TB. If active pulmonary TB is suspected, continue airborne isolation until pulmonary disease is excluded by negative cultures.     AFB Culture (no units)   Date Value   12/16/2020 No Acid Fast Bacilli Detected Pertinent Imaging Data:  CT Chest (10/15/20): Completed at [facility].   Impression:   1. Areas of bronchiectasis, bronchial wall thickening and cystic change with upper lobe predominance, pattern of disease that could   be seen in the setting of cystic fibrosis. Atypical infection could have a similar appearance.   2. Tree-in-bud opacities in the superior segment of the RIGHT lower lobe and scattered small pulmonary nodules about the periphery of   the RIGHT upper lobe. Correlate with any respiratory symptoms.??No consolidation.   3. Incidental imaging of upper abdominal contents shows indistinct boundaries of the pancreas perhaps related to prior pancreatitis. No   overt edema in the upper abdomen. Low-dose nature of the scan also limits detail in these areas, correlate with any ongoing abdominal   symptoms.     MRCP (10/12/20): Completed at [facility]   Impression:   1. No specific pancreatic abnormality to determine etiology of pancreatitis. Subtle indistinct margin through the neck and head of   the pancreas.??Pancreatic duct is not identified.   2. No secondary signs of autoimmune pancreatitis.   3. Normal common bile duct. ??No cholelithiasis.   4. No evidence of acute pancreatic inflammation.     CT Abd/Pelvis (07/29/20): Completed at [facility]  Impression:   1. Changes of acute??interstitial edematous pancreatitis. No localized fluid collections and no ductal dilatation.   2. Small amount of free fluid in the pelvis could be physiologic or reactive related to the pancreatitis.     Other Health Maintenance:   Vaccines:   Immunization History   Administered Date(s) Administered   ??? COVID-19 VACC,MRNA,(PFIZER)(PF)(IM) 02/21/2020, 03/16/2020, 10/15/2020   ??? DTaP 12/28/1992, 02/17/1993, 04/28/1993, 05/01/1994, 10/27/1996   ??? HPV Quadrivalent (Gardasil) 06/02/2009, 09/17/2009, 06/10/2010   ??? Hepatitis A Vaccine Pediatric / Adolescent 2 Dose IM 11/14/2006, 06/02/2009   ??? Hepatitis B vaccine, pediatric/adolescent dosage, 04[birth year]20[birth year]92, 12/28/1992, 07/28/1993   ??? HiB-PRP-T 12/28/1992, 02/17/1993, 04/28/1993, 02/02/1994   ??? Influenza Recomb PF (Quad) Injectable(Egg Free)18+ 09/02/2020   ??? Influenza Virus Vaccine, unspecified formulation 09/05/2020   ??? MMR 02/02/1994, 10/27/1996   ??? Meningococcal Conjugate MCV4P 06/10/2009   ??? PPD Test 04/14/2014   ??? Poliovirus,inactivated (IPV) 12/28/1992, 03/17/1993, 07/28/1993, 05/01/1994, 10/27/1996   ??? TdaP 06/10/2010, 09/10/2020   ??? Tetanus-Diptheria Toxoids-TD(TDVAX),Asdorbed,2LF(IM) 02/17/2005   ??? Varicella 11/06/1994, 11/14/2006

## 2021-06-30 NOTE — Unmapped (Signed)
Throat swab for CF sputum collected and tubed to lab.

## 2021-06-30 NOTE — Unmapped (Signed)
Adult CF Clinic Pharmacist Note: CFTR Modulator Lab Monitoring     [place] on Trikafta 2 orange in the AM and 1 blue in the PM.  Start date/month: Jan 2022    Latest LFTs done: 06/30/2021  AST   Date Value Ref Range Status   06/30/2021 27 <=34 U/L Final   04/15/2021 42 (H) 0 - 40 IU/L Final   12/16/2020 22 <=34 U/L Final     ALT   Date Value Ref Range Status   06/30/2021 24 10 - 49 U/L Final   04/15/2021 46 (H) 0 - 32 IU/L Final   12/16/2020 17 10 - 49 U/L Final     Alkaline Phosphatase   Date Value Ref Range Status   06/30/2021 48 46 - 116 U/L Final   04/15/2021 78 44 - 121 IU/L Final   12/16/2020 59 46 - 116 U/L Final     Total Bilirubin   Date Value Ref Range Status   06/30/2021 0.5 0.3 - 1.2 mg/dL Final   09/81/1914 0.3 0.0 - 1.2 mg/dL Final   78/29/5621 0.6 0.3 - 1.2 mg/dL Final     Assessment/Plan:  ?? AST WNL. ALT WNL  ?? Tbili WNL  ??? Recommend continuing Trikafta standard dose as prescribed.  ??? Next LFTs due in 3 months: Oct 2022    Alben Spittle, PharmD, P[place]y [place], CPP  Clinical Pharmacist Practitioner  [facility]  [phone]

## 2021-06-30 NOTE — Unmapped (Addendum)
Goals and plans we discussed today:  Nice to see you!  I'll let you know about your lab results  Come back in 3 months - we'll do a DEXA (bone density scan) at that time     Thank you for allowing [place] to be a part of your care!     To reach your CF nurse coordinators:    Patients with the last name A-K: Darlene Sutton [phone]  Patients with the last name L-Z: Darlene Sutton [phone]     For urgent issues after hours/weekends:  Hospital Operator: [phone]) [phone], ask for Pulmonary Fellow on-call     To make or change a clinic appointment:   [facility]: [phone]     --> When you should use MyChart:           - Order a prescription refill          - View test results          - Send a non-urgent message or update to the care team          - View after-visit summaries           - See or pay bills      --> When you should call (NOT use MyChart)           - Increase in cough          - Change in amount of mucus or mucus color           - Coughing up blood or blood-tinged mucus          - Chest pain          - Shortness of breath           - Lack of energy, feeling sick, or increase in tiredness     --> I don't have a MyChart. Why should I get one?           - It's encrypted, so your information is secure          - It's a quick, easy way to contact the care team, manage appointments, see test results, and more!      --> How do I sign-up for MyChart?            - Download the MyChart app from the [employer] or [employer] and sign-up in the app           - Sign-up online at [email or web address]

## 2021-07-13 NOTE — Unmapped (Signed)
[facility] Clinical Assessment & Refill Coordination Note    Darlene Sutton, DOB: February 08, 1991  Phone: [phone] (home)     All above HIPAA information was verified with patient.     Was a Nurse, learning disability used for this call? No    Specialty Medication(s):   CF/Pulmonary: -Trikafta     Current Outpatient Medications   Medication Sig Dispense Refill   ??? albuterol 2.5 mg /3 mL (0.083 %) nebulizer solution Inhale 1 vial (2.5 mg total) by nebulization daily before hypertonic saline nebs. May also inhale 1 vial (2.5 mg total) every six (6) hours as needed for shortness of breath/wheezing. 300 mL 11   ??? albuterol HFA 90 mcg/actuation inhaler Inhale 2 puffs daily prior to hypertonic saline nebs. May also inhale 2 puffs every six (6) hours as needed for shortness of breath/wheezing. 8.5 g 11   ??? elexacaftor-tezacaftor-ivacaft (TRIKAFTA) 100-50-75 mg(d) /150 mg (n) tablet Take 2 Tablets (Elexacaftor 100mg /Tezacaftor 50mg /Ivacaftor 75mg ) by mouth in the morning and 1 tablet (ivacaftor 150mg ) in the evening with fatty food 84 tablet 3   ??? fluticasone propionate (FLONASE) 50 mcg/actuation nasal spray 1 spray into each nostril two (2) times a day. 16 g 11   ??? loratadine (CLARITIN) 10 mg tablet Take 10 mg by mouth daily as needed.     ??? methylcellulose (CITRUCEL, SUCROSE,) oral powder      ??? sodium chloride 3.5 % Nebu Inhale 1 ampule by nebulization once daily. 240 mL 5     No current facility-administered medications for this visit.        Changes to medications: Darlene Sutton reports no changes at this time.    No Known Allergies    Changes to allergies: No    SPECIALTY MEDICATION ADHERENCE     Trikafta 100 mg: 14 days of medicine on hand       Medication Adherence    Patient reported X missed doses in the last month: 0  Specialty Medication: Trikafta 100mg   Patient is on additional specialty medications: No  Informant: patient          Specialty medication(s) dose(s) confirmed: Regimen is correct and unchanged.     Are there any concerns with adherence? No    Adherence counseling provided? Not needed    CLINICAL MANAGEMENT AND INTERVENTION      Clinical Benefit Assessment:    Do you feel the medicine is effective or helping your condition? Yes    Clinical Benefit counseling provided? Progress note from 7/7 shows evidence of clinical benefit    Adverse Effects Assessment:    Are you experiencing any side effects? No    Are you experiencing difficulty administering your medicine? No    Quality of Life Assessment:    Quality of Life    Rheumatology  On a scale of 1 - 10 with 1 representing not at all and 10 representing completely - how has your rheumatologic condition affected your:  Oncology  Dermatology          How many days over the past month did your CF  keep you from your normal activities? For example, brushing your teeth or getting up in the morning. 0    Have you discussed this with your provider? Not needed    Acute Infection Status:    Acute infections noted within Epic:  CF Patient  Patient reported infection: None    Therapy Appropriateness:    Is therapy appropriate? Yes, therapy is appropriate and should  be continued    DISEASE/MEDICATION-SPECIFIC INFORMATION      For CF patients: CF Healthwell [place] Active? Yes    PATIENT SPECIFIC NEEDS     - Does the patient have any physical, cognitive, or cultural barriers? No    - Is the patient high risk? No    - Does the patient require a Care Management Plan? No     - Does the patient require physician intervention or other additional services (i.e. nutrition, smoking cessation, social work)? No      SHIPPING     Specialty Medication(s) to be Shipped:   CF/Pulmonary: -Trikafta    Other medication(s) to be shipped: HTS 3.5%     Denied albuterol nebs and inhaler      Changes to insurance: No    Delivery Scheduled: Yes, Expected medication delivery date: 7/26.     Medication will be delivered via UPS to the confirmed prescription address in [facility].    The patient will receive a drug information handout for each medication shipped and additional FDA Medication Guides as required.  Verified that patient has previously received a Conservation officer, historic buildings and a Surveyor, mining.    The patient or caregiver noted above participated in the development of this care plan and knows that they can request review of or adjustments to the care plan at any time.      All of the patient's questions and concerns have been addressed.    Darlene Sutton   [facility] Specialty Pharmacist

## 2021-07-18 MED FILL — TRIKAFTA 100-50-75 MG (D)/150 MG (N) TABLETS: 28 days supply | Qty: 84 | Fill #3

## 2021-07-18 MED FILL — HYPER-SAL 3.5 % SOLUTION FOR NEBULIZATION: RESPIRATORY_TRACT | 60 days supply | Qty: 240 | Fill #3

## 2021-07-25 NOTE — Unmapped (Signed)
[facility]    Home Spirometry Monitoring      07/25/21        I have reviewed 1 home spirometry test(s) over prior calendar month.  Most recent test is normal.  Compared to prior home spirometry values, the most recent test is unchanged. Best in-clinic FEV1 in last year was 3.75 L (108%) on 03/17/21.         Quality of most recent test graded E.     Follow-up plan:   ??? Reached out to patient via MyChart.  ??? Continue monthly home spirometry monitoring for the management of cystic fibrosis.    Durenda Hurt [place], PA-C  [facility]   [phone]

## 2021-08-09 MED ORDER — TRIKAFTA 100-50-75 MG (D)/150 MG (N) TABLETS
ORAL_TABLET | 3 refills | 0 days | Status: CP
Start: 2021-08-09 — End: ?
  Filled 2021-08-15: qty 84, 28d supply, fill #0

## 2021-08-09 NOTE — Unmapped (Signed)
[facility] Refill Coordination Note    Specialty Medication(s) to be Shipped:   CF/Pulmonary: -Trikafta  Other medication(s) to be shipped: Albuterol HFA     Darlene Sutton, DOB: 10-02-1991  Phone: [phone] (home)     All above HIPAA information was verified with patient.     Was a Nurse, learning disability used for this call? No    Completed refill call assessment today to schedule patient's medication shipment from the [facility] [phone]).  All relevant notes have been reviewed.     Specialty medication(s) and dose(s) confirmed: Regimen is correct and unchanged.   Changes to medications: Sinclair reports no changes at this time.  Changes to insurance: No  New side effects reported not previously addressed with a pharmacist or physician: None reported  Questions for the pharmacist: No    Confirmed patient received a Conservation officer, historic buildings and a Surveyor, mining with first shipment. The patient will receive a drug information handout for each medication shipped and additional FDA Medication Guides as required.       DISEASE/MEDICATION-SPECIFIC INFORMATION        For CF patients: CF Healthwell Grant Active? Yes, **HWG TX til 11/15/21**    SPECIALTY MEDICATION ADHERENCE     Medication Adherence    Patient reported X missed doses in the last month: 0  Specialty Medication: Trikafta 100-50-75mg   Patient is on additional specialty medications: No  Patient is on more than two specialty medications: No  Informant: patient  Reliability of informant: reliable  Reasons for non-adherence: no problems identified  Confirmed plan for next specialty medication refill: delivery by pharmacy  Refills needed for supportive medications: not needed        Were doses missed due to medication being on hold? No    Trikafta 100-50-75mg  : 10 days of medicine on hand     REFERRAL TO PHARMACIST     Referral to the pharmacist: Not needed    [facility]     Shipping address confirmed in Epic.     Delivery Scheduled: Yes, Expected medication delivery date: 08/16/2021.     Medication will be delivered via UPS to the prescription address in Epic WAM.    [name] P Wet[facility] [place] [facility] Specialty Technician

## 2021-08-15 MED FILL — ALBUTEROL SULFATE HFA 90 MCG/ACTUATION AEROSOL INHALER: 25 days supply | Qty: 8.5 | Fill #5

## 2021-09-01 NOTE — Unmapped (Signed)
[facility]    Home Spirometry Monitoring      09/01/21        I have reviewed 1 home spirometry test(s) over prior calendar month.  Most recent test is normal.  Compared to prior home spirometry values, the most recent test is unchanged. Best in-clinic FEV1 in last year was 3.74 L (109%) on 06/30/21.         Quality of most recent test graded A.     Follow-up plan:   ??? Reached out to patient via MyChart.  ??? Continue monthly home spirometry monitoring for the management of cystic fibrosis.    Durenda Hurt [place], PA-C  [facility]   [phone]

## 2021-09-07 NOTE — Unmapped (Signed)
[facility] Refill Coordination Note    Specialty Medication(s) to be Shipped:   CF/Pulmonary: -Trikafta 100-50-75mg   Other medication(s) to be shipped: No additional medications requested for fill at this time     Darlene Sutton, DOB: 05/06/1991  Phone: [phone] (home)     All above HIPAA information was verified with patient.     Was a Nurse, learning disability used for this call? No    Completed refill call assessment today to schedule patient's medication shipment from the [facility] [phone]).  All relevant notes have been reviewed.     Specialty medication(s) and dose(s) confirmed: Regimen is correct and unchanged.   Changes to medications: Tanica reports no changes at this time.  Changes to insurance: No  New side effects reported not previously addressed with a pharmacist or physician: None reported  Questions for the pharmacist: No    Confirmed patient received a Conservation officer, historic buildings and a Surveyor, mining with first shipment. The patient will receive a drug information handout for each medication shipped and additional FDA Medication Guides as required.       DISEASE/MEDICATION-SPECIFIC INFORMATION        For CF patients: CF Healthwell Grant Active? Yes, **HWG TX til 11/15/21**    SPECIALTY MEDICATION ADHERENCE     Medication Adherence    Patient reported X missed doses in the last month: 0  Specialty Medication: Trikafta 100-50-75mg   Patient is on additional specialty medications: No  Patient is on more than two specialty medications: No  Informant: patient  Reliability of informant: reliable  Reasons for non-adherence: no problems identified  Confirmed plan for next specialty medication refill: delivery by pharmacy  Refills needed for supportive medications: not needed        Were doses missed due to medication being on hold? No    Trikafta 100-50-75mg  : 10-12 days of medicine on hand     REFERRAL TO PHARMACIST     Referral to the pharmacist: Not needed    [facility] Shipping address confirmed in Epic.     Delivery Scheduled: Yes, Expected medication delivery date: 09/13/2021.     Medication will be delivered via UPS to the prescription address in Epic WAM.    Darlene Sutton [facility] Specialty Technician

## 2021-09-13 MED FILL — TRIKAFTA 100-50-75 MG (D)/150 MG (N) TABLETS: 28 days supply | Qty: 84 | Fill #1

## 2021-09-29 ENCOUNTER — Ambulatory Visit: Admit: 2021-09-29 | Discharge: 2021-09-29 | Payer: PRIVATE HEALTH INSURANCE

## 2021-09-29 NOTE — Unmapped (Signed)
CF and AFB sputum collected per orders. Sample labeled in front of patient after confirming name and DOB. Sample tubed to the lab.     Shelba Flake Gentry Fitz, RN  CF Nurse Coordinator   [phone]

## 2021-09-29 NOTE — Unmapped (Signed)
Administered pfizer booster in R deltoid. Pt tolerated inj well. Had pt wait 15 min.

## 2021-09-29 NOTE — Unmapped (Signed)
[facility]  Adult Cystic Fibrosis Clinic        SW REFERRAL SOURCE/REASON: Darlene Sutton is a 30 y.o. female followed by [facility] for her CF. Focus of conversation today is reviewing MH/SA screeners with pt.       MENTAL HEALTH/COPING: SW reviewed the mental health screening process and introduced the substance use screening process.       Darlene Sutton self administered the PHQ-9 for depression and GAD-7 for anxiety. She scored a 1 on PHQ-9 indicating absence of depression and she scored a 1 on the GAD-7 indicating absence of anxiety. Darlene Sutton denied SI.    Darlene Sutton did not have any concerns regarding her mood.     No MH intervention needed at this time.      SUBSTANCE USE:  Darlene Sutton self-administered the AUDIT for alcohol use and the DAST-10 for drug abuse.  She scored a 0 on the AUDIT, indicating abstinence from alcohol use.  She scored a 0 on the DAST-10, indicating abstinence from drug use.      No SA intervention needed at this time.      PLAN: Darlene Sutton is aware that following SW Amy Celene Skeen will be giving her a call to complete psychosocial assessment. She said she is generally reachable in the afternoons. SW to continue to provide ongoing psychosocial assistance and support.     Darlene Mattes, LCSW

## 2021-09-29 NOTE — Unmapped (Signed)
Adult Cystic Fibrosis Clinic  Yearly Assessment    Current Inhaled Medications:     Albuterol Neb 2.5mg   Qday  Albuterol HFA 91mcg/2puffs PRN  Hypertonic Saline 3% Qday       Home O2: NA    Home Spirometer:  Uses about once a month    Review order meds are taken, frequency, compliance:  Patient takes meds daily.  Was given a new spacer for her Albuterol HFA    Barriers to Compliance and Solutions: NA    Airway Clearance Used:  Patient uses [place] daily with her treatments.     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  She is running about 1.5 miles at a time.    Airway Clearance/Not effective: NA    Cough Techniques: NA    Spacer/MDI training:NA    Bipap/Cpap;Settings if use: NA    Equipment Review/Cleaning: Boils equipment in microwave    DME Provider:    Misc. Notes:

## 2021-09-29 NOTE — Unmapped (Addendum)
Goals and plans we discussed today:  Great to see you!  Continue your meds as you are doing  Send me those documents through MyChart  Come back to see [place] in 3 months     Thank you for allowing [place] to be a part of your care!     To reach your CF nurse coordinators:    Patients with the last name A-K: Joni Reining [phone]  Patients with the last name L-Z: Harriett Sine [phone]     For urgent issues after hours/weekends:  Hospital Operator: [phone], ask for Pulmonary Fellow on-call     To make or change a clinic appointment:   [facility]: [phone]     --> When you should use MyChart:           - Order a prescription refill          - View test results          - Send a non-urgent message or update to the care team          - View after-visit summaries           - See or pay bills      --> When you should call (NOT use MyChart)           - Increase in cough          - Change in amount of mucus or mucus color           - Coughing up blood or blood-tinged mucus          - Chest pain          - Shortness of breath           - Lack of energy, feeling sick, or increase in tiredness     --> I don't have a MyChart. Why should I get one?           - It's encrypted, so your information is secure          - It's a quick, easy way to contact the care team, manage appointments, see test results, and more!      --> How do I sign-up for MyChart?            - Download the MyChart app from the [employer] or [employer] and sign-up in the app           - Sign-up online at [email or web address]

## 2021-09-30 NOTE — Unmapped (Signed)
Adult Cystic Fibrosis Clinic Pharmacist Visit     Darlene Sutton is a 30 y.o. 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 Pertinent CF related problems include GI manifestations.       OUTPATIENT CF RELATED MEDICATION REVIEW     Medications  Adherence/  Comments Labs as of 09/30/21   Assessment   Airway Clearance ??? albuterol nebs daily, albuterol HFA daily, HTS 3.5% daily   Primarily uses the HFA once a day, sometimes twice a day. When she is a bit more congested (like now) she will use her nebs once a week.    HTS every other night, sometimes every night. Last FEV1:   06/30/2021 - 108.6%  Today - 110.4% ??? Normal function (FEV1 >90%).Marland Kitchen PFTs today have improved.   Chronic Respiratory/  Sinus/  Allergy ?? Flonase 1 spray twice daily   ?? Claritin 10mg  once daily Taking as prescribed.     CFTR Modulator ??? Trikafta: 2 tablets (ELE/TEZ/IVA) QAM and 1 tablet (IVA) QPM   Taking as prescribed with fatty food. Typically almonds or peanut butter in the morning and dinner in the evening Latest LFTs  Lab Results   Component Value Date    AST 27 06/30/2021    ALT 24 06/30/2021    ALKPHOS 48 06/30/2021    BILITOT 0.5 06/30/2021    BILIDIR 0.20 06/30/2021    On since Jan 2022, current schedule of LFT monitoring: quarterly  ?? Last LFTs checked:   o AST WNL. ALT WNL  o Tbili WNL  o Due to recheck   Inhaled Antibiotics ??? N/A   N/A Sputum culture/AFB  CF Sputum Culture   Date Value Ref Range Status   06/30/2021 4+ Oropharyngeal Flora Isolated  Final   06/30/2021 <1+ Staphylococcus aureus (A)  Final     Comment:     Susceptibility Testing By Consultation Only   03/17/2021 4+ Oropharyngeal Flora Isolated  Final   03/17/2021 <1+ Staphylococcus aureus (A)  Final     Comment:     Susceptibility Testing By Consultation Only   12/16/2020 4+ Oropharyngeal Flora Isolated  Final   12/16/2020 1+ Methicillin-Susceptible Staphylococcus aureus (A)  Final     AFB Culture   Date Value Ref Range Status   12/16/2020 No Acid Fast Bacilli Detected  Final    Exacerbations: Not frequent.   ??? No antibiotic history available via chart review   Chronic ABX/  suppressive therapy   ??? N/A N/A     Pancreatic Enzyme ??? N/A - pancreatic sufficient        N/A      Vitamins ??? N/A   N/A Last vitamin levels  Lab Results   Component Value Date    VITAMINA 36.4 12/16/2020    VITDTOTAL 32.6 12/16/2020    VITAME 7.3 12/16/2020    PT 11.7 12/16/2020    INR 1.00 12/16/2020    Vitamin access: N/A  Last vitamin Levels:   ?? Vitamin A WNL. Vitamin D WNL. Vitamin E WNL. PT/INR WNL.  ??? Due to recheck in Dec     Other GI ??? Citrucel     Taking citrucel almost every morning. Has also been taking miralax every morning which has kept her fairly regular  ??? Constipation well controlled.    Endocrine ??? No h/o CFRD  ??? No h/o bone disease   N/A LAST OGTT:  No results found for: GLUF, GLUCOSE2HR    Last DEXA: Z-Scores  Image Area  06/30/21   Spine 0.8   Femur N/A   Femoral Neck -0.9   Total Hip  0    ??? Pancreatic sufficient  ??? Last DEXA done 06/30/21 - WNL.     Neuro/  Psych ??? N/A   N/A         Patient reported:  ??? Side Effects reported: No   ??? Insurance changes: No       MEDICATION MANAGEMENT   ??? Adherence: Moderate (Minor variances to doses prescribed, misses occasional doses during the week)  ??? Access: No issues related to access   ??? Pharmacies used for CF medications:   o [facility]    FOLLOW UP LAB(S)   ??? Modulator: quarterly LFTs (due now - can get locally or at next in-person visit)    PLAN     ??? Continue current medications     I spent a total of 10 minutes face to face with the patient delivering clinical care and providing education/counseling. Medications reviewed in EPIC medication station and updated today by the clinical pharmacist practitioner.     Recommendations and medication-related problems were discussed directly with pulmonologist, Durenda Hurt Rupcich, PA-C.    Electronically signed:  Alben Spittle, PharmD, BCACP, CPP  Clinical Pharmacist Practitioner  [facility]  [phone]      I am located on-site and the patient is located on-site for this visit.

## 2021-10-03 NOTE — Unmapped (Signed)
Pulmonary Cystic Fibrosis Clinic Note    ASSESSMENT     Darlene Sutton is a 30 y.o. female with cystic fibrosis who presents to the [facility] for a follow-up visit. She is doing well on Trikafta, and her FEV1 is stable at 110%. She recently noticed a slight increase in chest congestion with the weather change, but felt improved after adding Albuterol nebs before Hypertonic Saline 3% in the evening.     Problem List Items Addressed This Visit        Respiratory    Cystic fibrosis (CMS-HCC) - Primary    Relevant Orders    AFB culture    AFB SMEAR (Completed)    Mild intermittent asthma          PLAN     1) Collected CF sputum and AFB cultures   2) Continue Albuterol nebs, then Hypertonic Saline 3% + Aerobika once daily for airway clearance given bronchiectasis; continue Albuterol MDI PRN asthma symptoms   3) Continue Trikafta - monitor LFTs q26months for the first year, last checked by PCP in 08/2021 and were normal   4) Continue Claritin/Flonase for seasonal/environmental allergies   5) Continue use of home spirometer monthly, before all visits, and as needed when sick   6) For her constipation, continue Citrucel half dose daily and Miralax 1 dose every other day  7) Completed DEXA scan today, results were normal   8) Patient received Flu shot at her PCP's office; provided COVID bivalent booster today in clinic   9) She is established with a local PCP for women's health   10) She is due for annual CF labs at her next visit; no need for OGTT as she is pancreatic sufficient     The patient will return to clinic in 3 months, or sooner if clinically indicated.    Darlene Hurt [place], PA-C  [facility]   [phone]    SUBJECTIVE:        Darlene Sutton is a 30 y.o. year old female with cystic fibrosis who comes in today for a routine CF follow-up visit. 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 She established care with our CF Clinic in 11/2020. 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 She enjoyed this very much. 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 With the weather change, she started to feel a little more congestion in her chest. She increased her Hypertonic Saline 3% to nightly and switched from Albuterol MDI to Albuterol nebs beforehand. She reports that this helped. She denies sinus congestion, drainage, or pressure. Her allergy symptoms have been controlled with Claritin and Flonase.     She denies any GI concerns. She continues on Citrucel 1/2 dose daily and Miralax every other day. This keeps her regular, and she typically has a BM every other day. She denies abdominal pain, nausea, vomiting, diarrhea or constipation.     Her brother had CFTR testing and it revealed F508del and 7T/9T variant. He was seen by the [facility] in [place] and plans to follow up yearly.     She lives alone in [place]. 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 Her mood is doing well. She had some anxiety being responsible for 60  teenagers this Summer while traveling out west, but her anxiety is well-managed in her current job.     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 She had a CT Chest that showed evidence of bronchiectasis. She established care with our clinic in 11/2020 and has since started Trikafta, as well as Hypertonic Saline 3% daily with the Aerobika for airway clearance.     Airway Clearance Regimen: Darlene Sutton       Exercise Habits: Walking     Typical bacteria: MSSA     Last CF sputum culture: 09/29/21  Last AFB culture: 09/29/21    Inhaled ABX: None     Last oral antibiotics: Z-pack (date unknown)   Last IV antibiotics: Never     The patient judges that exacerbations requiring any antibiotic intervention (oral, inhaled, IV) are occurring about 0 times per year.    Hemoptysis: no  ABPA:  no  Ptx:  no  O2 needs:      no  Port:  no  Sinus symptoms (congestion, rhinorrhea, pain) are not present and this is felt to be better than last visit.     Panc Insuf: No (fecal elastase normal at 378 on 12/28/20)   PEG:  no  Supplements: no  DIOS:  no  CF Liver Dz:   no  Patient is having approximately 1 stool daily to every other day.  Stools are described as formed in appearance.     Last Colonoscopy: None (not due until age 71 for screening)    Diabetes: no.   Last OGTT: none; (Fasting n/a; 2 hour n/a)   HgbA1C: 12/16/20 (5.0%)    Osteopenia: no   Last DEXA: 09/29/21    Depression: no  Anxiety:  no  Adherence to medications and airway clearance is rated as Excellent.      Medications:  Reviewed with patient and updated in EPIC.  Outpatient Encounter Medications as of 09/29/2021   Medication Sig Dispense Refill   ??? albuterol 2.5 mg /3 mL (0.083 %) nebulizer solution Inhale 1 vial (2.5 mg total) by nebulization daily before hypertonic saline nebs. May also inhale 1 vial (2.5 mg total) every six (6) hours as needed for shortness of breath/wheezing. 300 mL 11   ??? albuterol HFA 90 mcg/actuation inhaler Inhale 2 puffs daily prior to hypertonic saline nebs. May also inhale 2 puffs every six (6) hours as needed for shortness of breath/wheezing. 8.5 g 11   ??? elexacaftor-tezacaftor-ivacaft (TRIKAFTA) 100-50-75 mg(d) /150 mg (n) tablet Take 2 Tablets (Elexacaftor 100mg /Tezacaftor 50mg /Ivacaftor 75mg ) by mouth in the morning and 1 tablet (ivacaftor 150mg ) in the evening with fatty food 84 tablet 3   ??? fluticasone propionate (FLONASE) 50 mcg/actuation nasal spray 1 spray into each nostril two (2) times a day. 16 g 11   ??? loratadine (CLARITIN) 10 mg tablet Take 10 mg by mouth daily as needed.     ??? methylcellulose (CITRUCEL, SUCROSE,) oral powder      ??? polyethylene glycol (GLYCOLAX) 17 gram/dose powder Take 17 g by mouth daily.     ??? sodium chloride 3.5 % Nebu Inhale 1 ampule by nebulization once daily. 240 mL 5     No facility-administered encounter medications on file as  of 09/29/2021.       Allergies:  No Known Allergies    Past Medical History:  Past Medical History:   Diagnosis Date   ??? Asthma    ??? 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 Pancreatitis, acute        Past Surgical History:  No past surgical history on file.    Social History:  Social History     Socioeconomic History   ??? Marital status: Single     Spouse name: None   ??? Number of children: None   ??? Years of education: None   ??? Highest education level: None   Occupational History   ??? Occupation: Runner, broadcasting/film/video / Therapist, sports: [employer]   Tobacco Use   ??? Smoking status: Never Smoker   ??? Smokeless tobacco: Never Used       Family History:  Family History   Problem Relation Age of Onset   ??? No Known Problems Mother    ??? No Known Problems Father    ??? No Known Problems Sister    ??? No Known Problems Sister    ??? No Known Problems Brother    ??? Asthma Brother    ??? Cystic fibrosis Paternal Uncle        Review of Systems:  A 12 point review of systems was negative except for pertinent items noted in the HPI.        OBJECTIVE:   Objective   Physical exam:   Vitals:    09/29/21 1416   BP: 99/73   BP Site: L Arm   BP Position: Sitting   BP Cuff Size: Medium   Pulse: 88   Temp: 36.2 ??C (97.2 ??F)   TempSrc: Temporal   SpO2: 99%   Weight: 71.7 kg (158 lb)     Body mass index is 25.54 kg/m??.  Wt Readings from Last 3 Encounters:   09/29/21 71.7 kg (158 lb)   06/30/21 66.7 kg (147 lb)   12/16/20 65.9 kg (145 lb 3.2 oz)       GEN: Cooperative female, sitting up on exam table, NAD  HEAD: Normocephalic, atraumatic  EYES: PERRLA, anicteric sclerae, conjuctiva clear  NOSE: Nares and mucosa normal with midline septum, no drainage or sinus tenderness.  OROPHARYNX: Pink and moist without erythema or exudate  NECK: Supple, trachea midline  HEART/CV: RRR, S1, S2 nl, no MRG  LUNGS: CTA bilaterally, no crackles or wheezes, normal WOB on RA  ABD: NABS, soft, NT/ND, no rebound or guarding, no masses, no hepatomegaly noted   EXT: No cyanosis, clubbing, or edema  SKIN: No rashes or lesions noted  NEURO: No focal deficits noted  PSYCH: Awake, alert, and interactive. Mood and affect appropriate.     Pulmonary Function Testing Results:  09/29/21        Interpretation: Spirometry is normal and unchanged from last visit.     Performed on 12/06/20 at [facility].   Results per CareEverywhere note:   FVC-Pre L 4.01   FVC-Predicted Pre % 99   FVC-Post L 3.97   FVC-Predicted Post % 98   Pre FEV1/FVC % % 83   Post FEV1/FCV % % 85   FEV1-Pre L 3.32   FEV1-Predicted Pre % 97   FEV1-Post L 3.39   DLCO uncorrected ml/min/mmHg 25.04   DLCO [place]% % 104   DLCO corrected ml/min/mmHg 25.04  DLCO COR %Predicted % 104   DLVA Predicted % 108   TLC L 5.95   TLC % Predicted % 111   RV % Predicted % 140     I reviewed interval medical records.    CF Monitoring:     Pertinent Laboratory Data:  Local LFT's 08/2021 done:       CBC  Lab Results   Component Value Date    WBC 3.5 04/15/2021    HGB 13.8 04/15/2021    HCT 39.6 04/15/2021    PLT 276 04/15/2021       ELECTROLYTES  Lab Results   Component Value Date    Sodium 138 12/16/2020     Lab Results   Component Value Date    Potassium 3.5 12/16/2020     Lab Results   Component Value Date    Chloride 104 12/16/2020     Lab Results   Component Value Date    CO2 24.6 12/16/2020     Lab Results   Component Value Date    BUN 11 12/16/2020     Lab Results   Component Value Date    Creatinine 0.61 12/16/2020     Lab Results   Component Value Date    Glucose 101 12/16/2020     Lab Results   Component Value Date    Calcium 10.2 12/16/2020     Lab Results   Component Value Date    Anion Gap 9 12/16/2020       GI  Lab Results   Component Value Date    AST 27 06/30/2021    AST 42 (H) 04/15/2021     Lab Results   Component Value Date    ALT 24 06/30/2021    ALT 46 (H) 04/15/2021     Lab Results   Component Value Date    Alkaline Phosphatase 48 06/30/2021    Alkaline Phosphatase 78 04/15/2021     Lab Results   Component Value Date    Total Protein 7.2 06/30/2021    Total Protein 7.2 04/15/2021     Lab Results   Component Value Date    Albumin 3.8 06/30/2021     Lab Results   Component Value Date    GGT <7 12/16/2020     Lab Results   Component Value Date    Total Bilirubin 0.5 06/30/2021    Total Bilirubin 0.3 04/15/2021       VITAMIN LEVELS / INR:    Lab Results   Component Value Date    Vitamin A 36.4 12/16/2020     No results found for: VITD2  No results found for: VITD3  Lab Results   Component Value Date    Vitamin D Total (25OH) 32.6 12/16/2020     Lab Results   Component Value Date    Vitamin E 7.3 12/16/2020       Lab Results   Component Value Date    PT 11.7 12/16/2020     Lab Results   Component Value Date    INR 1.00 12/16/2020       IGE  Lab Results   Component Value Date    IgE, Total 1,041 (H) 12/16/2020       SPUTUM CULTURES  CF Sputum Culture (no units)   Date Value   09/29/2021 4+ Oropharyngeal Flora Isolated   09/29/2021 1+ Staphylococcus aureus (A)     AFB Smear (no units)   Date Value   09/29/2021  NO ACID FAST BACILLI SEEN- 3 negative smears do not exclude pulmonary TB. If active pulmonary TB is suspected, continue airborne isolation until pulmonary disease is excluded by negative cultures.   12/16/2020     NO ACID FAST BACILLI SEEN- 3 negative smears do not exclude pulmonary TB. If active pulmonary TB is suspected, continue airborne isolation until pulmonary disease is excluded by negative cultures.     AFB Culture (no units)   Date Value   12/16/2020 No Acid Fast Bacilli Detected       Pertinent Imaging Data:  CT Chest (10/15/20): Completed at [facility].   Impression:   1. Areas of bronchiectasis, bronchial wall thickening and cystic change with upper lobe predominance, pattern of disease that could   be seen in the setting of cystic fibrosis. Atypical infection could have a similar appearance.   2. Tree-in-bud opacities in the superior segment of the RIGHT lower lobe and scattered small pulmonary nodules about the periphery of   the RIGHT upper lobe. Correlate with any respiratory symptoms.??No consolidation.   3. Incidental imaging of upper abdominal contents shows indistinct boundaries of the pancreas perhaps related to prior pancreatitis. No   overt edema in the upper abdomen. Low-dose nature of the scan also limits detail in these areas, correlate with any ongoing abdominal   symptoms.     MRCP (10/12/20): Completed at [facility]   Impression:   1. No specific pancreatic abnormality to determine etiology of pancreatitis. Subtle indistinct margin through the neck and head of   the pancreas.??Pancreatic duct is not identified.   2. No secondary signs of autoimmune pancreatitis.   3. Normal common bile duct. ??No cholelithiasis.   4. No evidence of acute pancreatic inflammation.     CT Abd/Pelvis (07/29/20): Completed at [facility]  Impression:   1. Changes of acute??interstitial edematous pancreatitis. No localized fluid collections and no ductal dilatation.   2. Small amount of free fluid in the pelvis could be physiologic or reactive related to the pancreatitis.     Other Health Maintenance:   Vaccines:   Immunization History   Administered Date(s) Administered   ??? COVID-19 VAC,IM,BV(31YR UP)BOOST,PFIZER 09/29/2021   ??? COVID-19 VACC,MRNA,(PFIZER)(PF)(IM) 02/21/2020, 03/16/2020, 10/15/2020   ??? DTaP 12/28/1992, 02/17/1993, 04/28/1993, 05/01/1994, 10/27/1996   ??? HPV Quadrivalent (Gardasil) 06/02/2009, 09/17/2009, 06/10/2010   ??? Hepatitis A Vaccine Pediatric / Adolescent 2 Dose IM 11/14/2006, 06/02/2009   ??? Hepatitis B vaccine, pediatric/adolescent dosage, 12-18-92, 12/28/1992, 07/28/1993   ??? HiB-PRP-T 12/28/1992, 02/17/1993, 04/28/1993, 02/02/1994   ??? Influenza Recomb PF (Quad) Injectable(Egg Free)18+ 09/02/2020   ??? Influenza Virus Vaccine, unspecified formulation 09/05/2020   ??? MMR 02/02/1994, 10/27/1996   ??? Meningococcal Conjugate MCV4P 06/10/2009   ??? PPD Test 04/14/2014   ??? Poliovirus,inactivated (IPV) 12/28/1992, 03/17/1993, 07/28/1993, 05/01/1994, 10/27/1996   ??? TdaP 06/10/2010, 09/10/2020   ??? Tetanus-Diptheria Toxoids-TD(TDVAX),Asdorbed,2LF(IM) 02/17/2005   ??? Varicella 11/06/1994, 11/14/2006

## 2021-10-05 NOTE — Unmapped (Signed)
[facility] Refill Coordination Note    Specialty Medication(s) to be Shipped:   CF/Pulmonary: -Trikafta    Other medication(s) to be shipped: Albuterol 90 mcg     Darlene Sutton, DOB: May 19, 1991  Phone: [phone] (home)       All above HIPAA information was verified with patient.     Was a Nurse, learning disability used for this call? No    Completed refill call assessment today to schedule patient's medication shipment from the [facility] [phone]).  All relevant notes have been reviewed.     Specialty medication(s) and dose(s) confirmed: Regimen is correct and unchanged.   Changes to medications: [name] reports no changes at this time.  Changes to insurance: No  New side effects reported not previously addressed with a pharmacist or physician: None reported  Questions for the pharmacist: No    Confirmed patient received a Conservation officer, historic buildings and a Surveyor, mining with first shipment. The patient will receive a drug information handout for each medication shipped and additional FDA Medication Guides as required.       DISEASE/MEDICATION-SPECIFIC INFORMATION        N/A    SPECIALTY MEDICATION ADHERENCE     Medication Adherence    Patient reported X missed doses in the last month: 0  Specialty Medication: Trikafta 100-50-75mg   Patient is on additional specialty medications: No  Patient is on more than two specialty medications: No  Informant: patient  Reliability of informant: reliable  Reasons for non-adherence: no problems identified  Confirmed plan for next specialty medication refill: delivery by pharmacy  Refills needed for supportive medications: not needed              Were doses missed due to medication being on hold? No    Trikafta 100-50-75 mg: 14 days of medicine on hand       REFERRAL TO PHARMACIST     Referral to the pharmacist: Not needed      [facility]     Shipping address confirmed in Epic.     Delivery Scheduled: Yes, Expected medication delivery date: 10/13/21. Medication will be delivered via UPS to the prescription address in Epic WAM.    Darlene Sutton [facility] Specialty Technician

## 2021-10-13 MED FILL — ALBUTEROL SULFATE HFA 90 MCG/ACTUATION AEROSOL INHALER: 25 days supply | Qty: 8.5 | Fill #6

## 2021-10-13 MED FILL — TRIKAFTA 100-50-75 MG (D)/150 MG (N) TABLETS: 28 days supply | Qty: 84 | Fill #2

## 2021-10-31 NOTE — Unmapped (Signed)
[facility]    Home Spirometry Monitoring      10/31/21        I have reviewed 2 home spirometry test(s) over prior calendar month.  Most recent test is normal.  Compared to prior home spirometry values, the most recent test is worse. Best in-clinic FEV1 in last year was 3.79 (110%) on 09/29/21.           Quality of most recent test graded A.     Follow-up plan:   ??? Reached out to patient via MyChart. Patient performed her home spirometry in the morning instead of her typical pattern of performing in the evening after her Albuterol and Hypertonic Saline. This likely accounts for the slight decrease in FEV1.   ??? Continue monthly home spirometry monitoring for the management of cystic fibrosis.    Durenda Hurt [place], PA-C  [facility]   [phone]

## 2021-11-03 NOTE — Unmapped (Signed)
[facility] Refill Coordination Note    Specialty Medication(s) to be Shipped:   CF/Pulmonary: -Trikafta    Other medication(s) to be shipped: albuterol neb sol     Darlene Sutton, DOB: [birth year]  Phone: [phone] (home)       All above HIPAA information was verified with patient.     Was a Nurse, learning disability used for this call? No    Completed refill call assessment today to schedule patient's medication shipment from the [facility] [phone]).  All relevant notes have been reviewed.     Specialty medication(s) and dose(s) confirmed: Regimen is correct and unchanged.   Changes to medications: Darlene Sutton reports no changes at this time.  Changes to insurance: No  New side effects reported not previously addressed with a pharmacist or physician: None reported  Questions for the pharmacist: No    Confirmed patient received a Conservation officer, historic buildings and a Surveyor, mining with first shipment. The patient will receive a drug information handout for each medication shipped and additional FDA Medication Guides as required.       DISEASE/MEDICATION-SPECIFIC INFORMATION        N/A    SPECIALTY MEDICATION ADHERENCE     Medication Adherence    Patient reported X missed doses in the last month: 0  Specialty Medication: Trikafta 100-50-75mg   Patient is on additional specialty medications: No  Patient is on more than two specialty medications: No  Informant: patient  Reliability of informant: reliable  Reasons for non-adherence: no problems identified              Were doses missed due to medication being on hold? No    trikafta  : 14 days of medicine on hand       REFERRAL TO PHARMACIST     Referral to the pharmacist: Not needed      [facility]     Shipping address confirmed in Epic.     Delivery Scheduled: Yes, Expected medication delivery date: 11/16.     Medication will be delivered via UPS to the prescription address in Epic WAM.    Westley Gambles   [facility] Specialty Technician

## 2021-11-08 MED FILL — TRIKAFTA 100-50-75 MG (D)/150 MG (N) TABLETS: 28 days supply | Qty: 84 | Fill #3

## 2021-11-08 MED FILL — ALBUTEROL SULFATE 2.5 MG/3 ML (0.083 %) SOLUTION FOR NEBULIZATION: RESPIRATORY_TRACT | 22 days supply | Qty: 270 | Fill #2

## 2021-12-01 MED ORDER — TRIKAFTA 100-50-75 MG (D)/150 MG (N) TABLETS
ORAL_TABLET | ORAL | 2 refills | 0.00000 days | Status: CP
Start: 2021-12-01 — End: 2021-12-01
  Filled 2021-12-06: qty 84, 28d supply, fill #0

## 2021-12-01 NOTE — Unmapped (Addendum)
[facility] Refill Coordination Note    Specialty Medication(s) to be Shipped:   CF/Pulmonary: -Trikafta    Other medication(s) to be shipped: Hyper-sal 3.5%     Darlene Sutton, DOB: 07-08-1991  Phone: [phone] (home)       All above HIPAA information was verified with patient.     Was a Nurse, learning disability used for this call? No    Completed refill call assessment today to schedule patient's medication shipment from the [facility] [phone]).  All relevant notes have been reviewed.     Specialty medication(s) and dose(s) confirmed: Regimen is correct and unchanged.   Changes to medications: Darlene Sutton reports no changes at this time.  Changes to insurance: No  New side effects reported not previously addressed with a pharmacist or physician: None reported  Questions for the pharmacist: No    Confirmed patient received a Conservation officer, historic buildings and a Surveyor, mining with first shipment. The patient will receive a drug information handout for each medication shipped and additional FDA Medication Guides as required.       DISEASE/MEDICATION-SPECIFIC INFORMATION        N/A    SPECIALTY MEDICATION ADHERENCE     Medication Adherence    Patient reported X missed doses in the last month: 0  Specialty Medication: Trikafta 100-50-75mg   Patient is on additional specialty medications: No  Patient is on more than two specialty medications: No  Informant: patient  Reliability of informant: reliable  Reasons for non-adherence: no problems identified  Confirmed plan for next specialty medication refill: delivery by pharmacy  Refills needed for supportive medications: not needed              Were doses missed due to medication being on hold? No    Trikafta 100-50-75 mg: 10-14 days of medicine on hand        REFERRAL TO PHARMACIST     Referral to the pharmacist: Not needed      [facility]     Shipping address confirmed in Epic.     Delivery Scheduled: Yes, Expected medication delivery date: 12/07/21. However, Rx request for refills was sent to the provider as there are none remaining.     Medication will be delivered via UPS to the prescription address in Epic WAM.    Nancy Nordmann [facility] Specialty Technician

## 2021-12-01 NOTE — Unmapped (Signed)
Adult Cystic Fibrosis Clinic Pharmacist Note      Received prescription renewal request for [facility]. Pt had CMP on 09/05/21 at PCP. LFTs WNL. See myChart message from 09/29/21. Next LFTs due mid Dec 2022. Pt has clinic appt on 12/29/21, will plan to repeat labs at that time.      1. Cystic fibrosis (CMS-HCC)    - elexacaftor-tezacaftor-ivacaft (TRIKAFTA) 100-50-75 mg(d) /150 mg (n) tablet; Take 2 Tablets (Elexacaftor 100mg /Tezacaftor 50mg /Ivacaftor 75mg ) by mouth in the morning and 1 tablet (ivacaftor 150mg ) in the evening with fatty food  Dispense: 84 tablet; Refill: 2    Pharmacy sent to:  [facility]    Electronically signed by:  Alben Spittle, PharmD, BCACP, CPP  Clinical Pharmacist Practitioner  [facility]  [phone]

## 2021-12-06 MED FILL — HYPER-SAL 3.5 % SOLUTION FOR NEBULIZATION: RESPIRATORY_TRACT | 60 days supply | Qty: 240 | Fill #4

## 2021-12-06 NOTE — Unmapped (Signed)
[place] [facility]    Home Spirometry Monitoring      12/05/21        I have reviewed 1 home spirometry test(s) over prior calendar month.  Most recent test is normal.  Compared to prior home spirometry values, the most recent test is unchanged. Best in-clinic FEV1 in last year was 3.79 L (110%) on 09/29/21.         Quality of most recent test graded A.     Follow-up plan:   ??? Reached out to patient via MyChart.  ??? Continue monthly home spirometry monitoring for the management of cystic fibrosis.    Durenda Hurt [place], PA-C  [facility]   [phone]

## 2021-12-29 ENCOUNTER — Ambulatory Visit: Admit: 2021-12-29 | Discharge: 2021-12-29 | Payer: PRIVATE HEALTH INSURANCE

## 2021-12-29 ENCOUNTER — Ambulatory Visit
Admit: 2021-12-29 | Discharge: 2021-12-29 | Payer: PRIVATE HEALTH INSURANCE | Attending: Registered" | Primary: Registered"

## 2021-12-29 LAB — CBC W/ AUTO DIFF
BASOPHILS ABSOLUTE COUNT: 0 10*9/L (ref 0.0–0.1)
BASOPHILS RELATIVE PERCENT: 0.9 %
EOSINOPHILS ABSOLUTE COUNT: 0.2 10*9/L (ref 0.0–0.5)
EOSINOPHILS RELATIVE PERCENT: 4.6 %
HEMATOCRIT: 37.1 % (ref 34.0–44.0)
HEMOGLOBIN: 12.8 g/dL (ref 11.3–14.9)
LYMPHOCYTES ABSOLUTE COUNT: 1.6 10*9/L (ref 1.1–3.6)
LYMPHOCYTES RELATIVE PERCENT: 41 %
MEAN CORPUSCULAR HEMOGLOBIN CONC: 34.4 g/dL (ref 32.0–36.0)
MEAN CORPUSCULAR HEMOGLOBIN: 30.8 pg (ref 25.9–32.4)
MEAN CORPUSCULAR VOLUME: 89.6 fL (ref 77.6–95.7)
MEAN PLATELET VOLUME: 8.2 fL (ref 6.8–10.7)
MONOCYTES ABSOLUTE COUNT: 0.3 10*9/L (ref 0.3–0.8)
MONOCYTES RELATIVE PERCENT: 6.6 %
NEUTROPHILS ABSOLUTE COUNT: 1.8 10*9/L (ref 1.8–7.8)
NEUTROPHILS RELATIVE PERCENT: 46.9 %
NUCLEATED RED BLOOD CELLS: 0 /100{WBCs} (ref <=4)
PLATELET COUNT: 299 10*9/L (ref 150–450)
RED BLOOD CELL COUNT: 4.14 10*12/L (ref 3.95–5.13)
RED CELL DISTRIBUTION WIDTH: 12.7 % (ref 12.2–15.2)
WBC ADJUSTED: 3.9 10*9/L (ref 3.6–11.2)

## 2021-12-29 LAB — HEMOGLOBIN A1C
ESTIMATED AVERAGE GLUCOSE: 91 mg/dL
HEMOGLOBIN A1C: 4.8 % (ref 4.8–5.6)

## 2021-12-29 LAB — COMPREHENSIVE METABOLIC PANEL
ALBUMIN: 4.1 g/dL (ref 3.4–5.0)
ALKALINE PHOSPHATASE: 55 U/L (ref 46–116)
ALT (SGPT): 16 U/L (ref 10–49)
ANION GAP: 9 mmol/L (ref 5–14)
AST (SGOT): 20 U/L (ref <=34)
BILIRUBIN TOTAL: 0.6 mg/dL (ref 0.3–1.2)
BLOOD UREA NITROGEN: 11 mg/dL (ref 9–23)
BUN / CREAT RATIO: 15
CALCIUM: 10 mg/dL (ref 8.7–10.4)
CHLORIDE: 104 mmol/L (ref 98–107)
CO2: 23.8 mmol/L (ref 20.0–31.0)
CREATININE: 0.71 mg/dL
EGFR CKD-EPI (2021) FEMALE: >90 mL/min/{1.73_m2} (ref >=60)
GLUCOSE RANDOM: 96 mg/dL (ref 70–179)
POTASSIUM: 4.1 mmol/L (ref 3.4–4.8)
PROTEIN TOTAL: 7.7 g/dL (ref 5.7–8.2)
SODIUM: 137 mmol/L (ref 135–145)

## 2021-12-29 LAB — PROTIME-INR
INR: 1.08
PROTIME: 12.2 s (ref 9.8–12.8)

## 2021-12-29 MED ORDER — SODIUM CHLORIDE 3.5 % FOR NEBULIZATION
Freq: Every day | RESPIRATORY_TRACT | 5 refills | 60 days | Status: CP
Start: 2021-12-29 — End: ?
  Filled 2022-03-01: qty 240, 60d supply, fill #0

## 2021-12-29 MED ORDER — ALBUTEROL SULFATE 2.5 MG/3 ML (0.083 %) SOLUTION FOR NEBULIZATION
RESPIRATORY_TRACT | 11 refills | 0.00000 days | Status: CP
Start: 2021-12-29 — End: ?

## 2021-12-29 NOTE — Unmapped (Signed)
[facility] Refill Coordination Note    Specialty Medication(s) to be Shipped:   CF/Pulmonary: -Trikafta  Other medication(s) to be shipped: Albuterol HFA     Darlene Sutton, DOB: 1991-11-21  Phone: [phone] (home)     All above HIPAA information was verified with patient.     Was a Nurse, learning disability used for this call? No    Completed refill call assessment today to schedule patient's medication shipment from the [facility] [phone]).  All relevant notes have been reviewed.     Specialty medication(s) and dose(s) confirmed: Regimen is correct and unchanged.   Changes to medications: Kamarie reports no changes at this time.  Changes to insurance: No  New side effects reported not previously addressed with a pharmacist or physician: None reported  Questions for the pharmacist: No    Confirmed patient received a Conservation officer, historic buildings and a Surveyor, mining with first shipment. The patient will receive a drug information handout for each medication shipped and additional FDA Medication Guides as required.       DISEASE/MEDICATION-SPECIFIC INFORMATION        For CF patients: CF [facility] Active? Yes, **HWG TX til 11/15/22**    SPECIALTY MEDICATION ADHERENCE     Medication Adherence    Patient reported X missed doses in the last month: 0  Specialty Medication: Trikafta 100-50-75mg   Patient is on more than two specialty medications: No  Informant: patient  Reliability of informant: reliable  Reasons for non-adherence: no problems identified  Confirmed plan for next specialty medication refill: delivery by pharmacy  Refills needed for supportive medications: not needed        Were doses missed due to medication being on hold? No    Trikafta 100-50-75mg  : 7-10 days of medicine on hand     REFERRAL TO PHARMACIST     Referral to the pharmacist: Not needed    [facility]     Shipping address confirmed in Epic.     Delivery Scheduled: Yes, Expected medication delivery date: 01/03/2022.     Medication will be delivered via UPS to the prescription address in Epic WAM.    Quaid Yeakle P Wetzel [facility] Specialty Technician

## 2021-12-29 NOTE — Unmapped (Signed)
Throat swab for CF sputum collected and tubed to lab.

## 2021-12-29 NOTE — Unmapped (Signed)
Pulmonary Cystic Fibrosis Clinic Note    ASSESSMENT     Darlene Sutton is a 31 y.o. female with cystic fibrosis who presents to the [facility] for a routine follow-up visit. She is doing well on Trikafta, and her FEV1 is increased at 115%. This may be due to her asthma being better controlled in the setting of less seasonal allergies. She has no concerns today.     Problem List Items Addressed This Visit        Respiratory    Cystic fibrosis (CMS-HCC) - Primary    Relevant Medications    albuterol 2.5 mg /3 mL (0.083 %) nebulizer solution    Asthma    Relevant Medications    albuterol 2.5 mg /3 mL (0.083 %) nebulizer solution   Other Visit Diagnoses     CF (cystic fibrosis) (CMS-HCC)        Relevant Medications    albuterol 2.5 mg /3 mL (0.083 %) nebulizer solution    sodium chloride 3.5 % Nebu    Other Relevant Orders    CBC w/ Differential (Completed)    Vitamin A    Vitamin D 25 Hydroxy (25OH D2 + D3)    Vitamin E    PT-INR (Completed)    Hemoglobin A1c (Completed)    IgE Total    Comprehensive Metabolic Panel (Completed)          PLAN     1) Collected CF sputum culture via OP swab   2) Continue Albuterol nebs, then Hypertonic Saline 3% + Aerobika once daily for airway clearance given bronchiectasis; continue Albuterol MDI PRN asthma symptoms; discussed option to stop Hypertonic Saline as the Simplify trial showed that CF patients on Trikafta with FEV1 > 70% did not have a decline in lung function after stopping this therapy, but she would like to continue at this time.   3) Continue Trikafta - monitor LFT's yearly, checked today  4) Continue Claritin/Flonase for seasonal/environmental allergies   5) Continue use of home spirometer monthly, before all visits, and as needed when sick   6) For her constipation, continue Miralax daily   7) Collected annual CF labs today; no OGTT as she is pancreatic sufficient   8) Patient has received a Flu vaccine and COVID booster this season   80) She is established with a local PCP for women's health     The patient will return to clinic in 3 months, or sooner if clinically indicated.    Durenda Hurt Zeeland, PA-C  [facility]   [phone]    SUBJECTIVE:        Darlene Sutton is a 31 y.o. year old female with cystic fibrosis who comes in today for a routine CF follow-up visit. Since her last visit, she has been feeling well. Over the holidays, she went to visit a family member who has a cat and stayed for 1 night. She had some wheezing, itchy eyes, and sneezing. She took Claritin, and her wheezing improved with Albuterol. She reports that normally after visiting that family member she would get sick, and this time, she was able to return to her usual baseline after the visit. She continues to use Albuterol and Hypertonic Saline 3% about once daily. If she misses the Hypertonic Saline, she doesn't fret over it. She denies sinus congestion, drainage, or pressure.     She denies any GI concerns. She stopped Citrucel due to not liking the taste and continue on Miralax once  daily in her coffee. This keeps her regular, and she typically has a BM daily to every other day. She denies abdominal pain, nausea, vomiting, diarrhea or constipation.     She lives alone in [place]. 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 She went on a couple trips this Fall and Winter with her sisters and is hoping to go away for Spring Break.     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 She had a CT Chest that showed evidence of bronchiectasis. She established care with our clinic in 11/2020 and afterwards started Trikafta.     Airway Clearance Regimen: Waymon Budge       Exercise Habits: Walking     Typical bacteria: MSSA Last CF sputum culture: 09/29/21  Last AFB culture: 09/29/21    Inhaled ABX: None     Last oral antibiotics: Z-pack (date unknown)   Last IV antibiotics: Never     The patient judges that exacerbations requiring any antibiotic intervention (oral, inhaled, IV) are occurring about <1 times per year.    Hemoptysis: no  ABPA:  no  Ptx:  no  O2 needs:      no  Port:  no  Sinus symptoms (congestion, rhinorrhea, pain) are not present and this is felt to be better than last visit.     Panc Insuf: No (fecal elastase normal at 378 on 12/28/20)   PEG:  no  Supplements: no  DIOS:  no  CF Liver Dz:   no  Patient is having approximately 1 stool daily to every other day.  Stools are described as formed in appearance.     Last Colonoscopy: None (not due until age 72 per CFF guidelines)    Diabetes: no.   Last OGTT: none; (Fasting n/a; 2 hour n/a)   HgbA1C: 12/16/20 (5.0%)    Osteopenia: no   Last DEXA: 09/29/21    Depression: no  Anxiety:  no  Adherence to medications and airway clearance is rated as Excellent.      Medications:  Reviewed with patient and updated in EPIC.  Outpatient Encounter Medications as of 12/29/2021   Medication Sig Dispense Refill   ??? albuterol HFA 90 mcg/actuation inhaler Inhale 2 puffs daily prior to hypertonic saline nebs. May also inhale 2 puffs every six (6) hours as needed for shortness of breath/wheezing. 8.5 g 11   ??? elexacaftor-tezacaftor-ivacaft (TRIKAFTA) 100-50-75 mg(d) /150 mg (n) tablet Take 2 Tablets (Elexacaftor 100mg /Tezacaftor 50mg /Ivacaftor 75mg ) by mouth in the morning and 1 tablet (ivacaftor 150mg ) in the evening with fatty food 84 tablet 2   ??? fluticasone propionate (FLONASE) 50 mcg/actuation nasal spray 1 spray into each nostril two (2) times a day. 16 g 11   ??? loratadine (CLARITIN) 10 mg tablet Take 10 mg by mouth daily as needed.     ??? methylcellulose (CITRUCEL, SUCROSE,) oral powder      ??? [DISCONTINUED] albuterol 2.5 mg /3 mL (0.083 %) nebulizer solution Inhale 1 vial (2.5 mg total) by nebulization daily before hypertonic saline nebs. May also inhale 1 vial (2.5 mg total) every six (6) hours as needed for shortness of breath/wheezing. 300 mL 11   ??? [DISCONTINUED] sodium chloride 3.5 % Nebu  Inhale 1 ampule by nebulization once daily. 240 mL 5   ??? albuterol 2.5 mg /3 mL (0.083 %) nebulizer solution Inhale 1 vial (2.5 mg total) by nebulization daily before hypertonic saline nebs. May also inhale 1 vial (2.5 mg total) every six (6) hours as needed for shortness of breath/wheezing. 300 mL 11   ??? polyethylene glycol (GLYCOLAX) 17 gram/dose powder Take 17 g by mouth daily.     ??? sodium chloride 3.5 % Nebu Inhale 1 ampule by nebulization once daily. 240 mL 5     No facility-administered encounter medications on file as of 12/29/2021.       Allergies:  No Known Allergies    Past Medical History:  Past Medical History:   Diagnosis Date   ??? Asthma    ??? 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 Pancreatitis, acute        Past Surgical History:  No past surgical history on file.    Social History:  Social History     Socioeconomic History   ??? Marital status: Single     Spouse name: None   ??? Number of children: None   ??? Years of education: None   ??? Highest education level: None   Occupational History   ??? Occupation: Runner, broadcasting/film/video / Therapist, sports: [employer]   Tobacco Use   ??? Smoking status: Never   ??? Smokeless tobacco: Never       Family History:  Family History   Problem Relation Age of Onset   ??? No Known Problems Mother    ??? No Known Problems Father    ??? No Known Problems Sister    ??? No Known Problems Sister    ??? No Known Problems Brother    ??? Asthma Brother    ??? Cystic fibrosis Paternal Uncle        Review of Systems:  A 12 point review of systems was negative except for pertinent items noted in the HPI.        OBJECTIVE:   Objective   Physical exam:   Vitals:    12/29/21 1405   BP: 108/76   Pulse: 88   Temp: 36.6 ??C (97.8 ??F) TempSrc: Temporal   SpO2: 98%   Weight: 72.6 kg (160 lb)     Body mass index is 25.87 kg/m??.  Wt Readings from Last 3 Encounters:   12/29/21 72.6 kg (160 lb)   09/29/21 71.7 kg (158 lb)   06/30/21 66.7 kg (147 lb)       GEN: Cooperative female, sitting up on exam table, NAD  HEAD: Normocephalic, atraumatic  EYES: PERRLA, anicteric sclerae, conjuctiva clear  EARS: TM's are normal bilaterally  NOSE: Nares and mucosa normal with midline septum, no drainage or sinus tenderness.  OROPHARYNX: Pink and moist without erythema or exudate  NECK: Supple, trachea midline  LYMPH: No palpable lymphadenopathy   HEART/CV: RRR, S1, S2 nl, no MRG  LUNGS: CTA bilaterally, no crackles or wheezes, normal WOB on RA  ABD: NABS, soft, NT/ND, no rebound or guarding, no masses, no hepatomegaly noted   EXT: No cyanosis, clubbing, or edema  SKIN: No rashes or lesions noted  NEURO: No focal deficits noted  PSYCH: Awake, alert, and interactive. Mood and affect appropriate.     Pulmonary Function Testing Results:  12/29/21        Interpretation: Spirometry is normal and  FEV1 is increased from last visit.     Performed on 12/06/20 at [facility].   Results per CareEverywhere note:   FVC-Pre L 4.01   FVC-Predicted Pre % 99   FVC-Post L 3.97   FVC-Predicted Post % 98   Pre FEV1/FVC % % 83   Post FEV1/FCV % % 85   FEV1-Pre L 3.32   FEV1-Predicted Pre % 97   FEV1-Post L 3.39   DLCO uncorrected ml/min/mmHg 25.04   DLCO [place]% % 104   DLCO corrected ml/min/mmHg 25.04   DLCO COR %Predicted % 104   DLVA Predicted % 108   TLC L 5.95   TLC % Predicted % 111   RV % Predicted % 140     I reviewed interval medical records.    CF Monitoring:     Pertinent Laboratory Data:    CBC  Lab Results   Component Value Date    WBC 3.9 12/29/2021    HGB 12.8 12/29/2021    HCT 37.1 12/29/2021    PLT 299 12/29/2021       ELECTROLYTES  Lab Results   Component Value Date    Sodium 137 12/29/2021     Lab Results   Component Value Date    Potassium 4.1 12/29/2021 Lab Results   Component Value Date    Chloride 104 12/29/2021     Lab Results   Component Value Date    CO2 23.8 12/29/2021     Lab Results   Component Value Date    BUN 11 12/29/2021     Lab Results   Component Value Date    Creatinine 0.71 12/29/2021     Lab Results   Component Value Date    Glucose 96 12/29/2021     Lab Results   Component Value Date    Calcium 10.0 12/29/2021     Lab Results   Component Value Date    Anion Gap 9 12/29/2021       GI  Lab Results   Component Value Date    AST 20 12/29/2021    AST 42 (H) 04/15/2021     Lab Results   Component Value Date    ALT 16 12/29/2021    ALT 46 (H) 04/15/2021     Lab Results   Component Value Date    Alkaline Phosphatase 55 12/29/2021    Alkaline Phosphatase 78 04/15/2021     Lab Results   Component Value Date    Total Protein 7.7 12/29/2021    Total Protein 7.2 04/15/2021     Lab Results   Component Value Date    Albumin 4.1 12/29/2021     Lab Results   Component Value Date    GGT <7 12/16/2020     Lab Results   Component Value Date    Total Bilirubin 0.6 12/29/2021    Total Bilirubin 0.3 04/15/2021       VITAMIN LEVELS / INR:    Lab Results   Component Value Date    Vitamin A 36.4 12/16/2020     No results found for: VITD2  No results found for: VITD3  Lab Results   Component Value Date    Vitamin D Total (25OH) 32.6 12/16/2020     Lab Results   Component Value Date    Vitamin E 7.3 12/16/2020       Lab Results   Component Value Date    PT 12.2 12/29/2021     Lab Results  Component Value Date    INR 1.08 12/29/2021       IGE  Lab Results   Component Value Date    IgE, Total 1,041 (H) 12/16/2020       SPUTUM CULTURES  CF Sputum Culture (no units)   Date Value   12/29/2021 3+ Oropharyngeal Flora Isolated   12/29/2021 <1+ Staphylococcus aureus (A)     AFB Smear (no units)   Date Value   09/29/2021     NO ACID FAST BACILLI SEEN- 3 negative smears do not exclude pulmonary TB. If active pulmonary TB is suspected, continue airborne isolation until pulmonary disease is excluded by negative cultures.   12/16/2020     NO ACID FAST BACILLI SEEN- 3 negative smears do not exclude pulmonary TB. If active pulmonary TB is suspected, continue airborne isolation until pulmonary disease is excluded by negative cultures.     AFB Culture (no units)   Date Value   09/29/2021 No Acid Fast Bacilli Detected   12/16/2020 No Acid Fast Bacilli Detected       Pertinent Imaging Data:  CT Chest (10/15/20): Completed at [facility].   Impression:   1. Areas of bronchiectasis, bronchial wall thickening and cystic change with upper lobe predominance, pattern of disease that could   be seen in the setting of cystic fibrosis. Atypical infection could have a similar appearance.   2. Tree-in-bud opacities in the superior segment of the RIGHT lower lobe and scattered small pulmonary nodules about the periphery of   the RIGHT upper lobe. Correlate with any respiratory symptoms.??No consolidation.   3. Incidental imaging of upper abdominal contents shows indistinct boundaries of the pancreas perhaps related to prior pancreatitis. No   overt edema in the upper abdomen. Low-dose nature of the scan also limits detail in these areas, correlate with any ongoing abdominal   symptoms.     MRCP (10/12/20): Completed at [facility]   Impression:   1. No specific pancreatic abnormality to determine etiology of pancreatitis. Subtle indistinct margin through the neck and head of   the pancreas.??Pancreatic duct is not identified.   2. No secondary signs of autoimmune pancreatitis.   3. Normal common bile duct. ??No cholelithiasis.   4. No evidence of acute pancreatic inflammation.     CT Abd/Pelvis (07/29/20): Completed at [facility]  Impression:   1. Changes of acute??interstitial edematous pancreatitis. No localized fluid collections and no ductal dilatation.   2. Small amount of free fluid in the pelvis could be physiologic or reactive related to the pancreatitis.     Other Health Maintenance:   Vaccines: Immunization History   Administered Date(s) Administered   ??? COVID-19 VAC,BIVALENT(68YR UP)BOOST,PFIZER 09/29/2021   ??? COVID-19 VACC,MRNA,(PFIZER)(PF) 02/21/2020, 03/16/2020, 10/15/2020   ??? DTaP 12/28/1992, 02/17/1993, 04/28/1993, 05/01/1994, 10/27/1996   ??? HPV Quadrivalent (Gardasil) 06/02/2009, 09/17/2009, 06/10/2010   ??? Hepatitis A Vaccine Pediatric / Adolescent 2 Dose IM 11/14/2006, 06/02/2009   ??? Hepatitis B vaccine, pediatric/adolescent dosage, 07-30-1992, 12/28/1992, 07/28/1993   ??? HiB-PRP-T 12/28/1992, 02/17/1993, 04/28/1993, 02/02/1994   ??? Influenza Recomb PF (Quad) Injectable(Egg Free)18+ 09/02/2020   ??? Influenza Virus Vaccine, unspecified formulation 09/05/2020   ??? MMR 02/02/1994, 10/27/1996   ??? Meningococcal Conjugate MCV4P 06/10/2009   ??? PPD Test 04/14/2014   ??? Poliovirus,inactivated (IPV) 12/28/1992, 03/17/1993, 07/28/1993, 05/01/1994, 10/27/1996   ??? TdaP 06/10/2010, 09/10/2020   ??? Tetanus-Diptheria Toxoids-TD(TDVAX),Asdorbed,2LF(IM) 02/17/2005   ??? Varicella 11/06/1994, 11/14/2006

## 2021-12-30 NOTE — Unmapped (Signed)
[facility]  Adult Cystic Fibrosis Clinic        SW REFERRAL SOURCE/REASON: Darlene Sutton is a 31 y.o. female followed by [facility] for her CF. Focus of conversation today is conducting assessment and addressing psychosocial needs. Assessment was completed in person.                 CURRENT LIVING ARRANGEMENTS/SUPPORTS:     Darlene Sutton lives in [place], [place] and reports a good support system of friends and family. She has 4 siblings and is the only one with CF.      SAFETY:       Denies safety concerns.      TRANSPORTATION:     Denies concerns associated with transportation.      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 She denies any financial concerns at this time.          HOBBIES/INTERESTS:    Darlene Sutton enjoys traveling     INSURANCE:   Darlene Sutton has the [employer] and denies concerns with coverage.    FOOD INSECURITY:   Denies    ADHERENCE:   No concerns with adherence. Darlene Sutton was diagnosed with CF a little over a year ago and has been taking Trikafta.         MENTAL HEALTH/COPING: SW reviewed the mental health screening process and introduced the substance use screening process.       Darlene Sutton self administered the PHQ-9 for depression and GAD-7 for anxiety. She scored a zero on PHQ-9 indicating no depression and she scored a zero on the GAD-7 indicating no anxiety. She denies SI.    PHQ-9 Score: 0    Screening complete, no depression identified / no further action needed today        SUBSTANCE USE:  Darlene Sutton self-administered the AUDIT for alcohol use and the DAST-10 for drug abuse.  She scored a zero on the AUDIT, indicating no alcohol use.  She scored a zero on the DAST-10, indicating no drug use.      LONG-TERM HEALTH CARE PLANNING:     Did not address at this visit.     PLAN:   Continue to provide on-going psycho-social support    Sathvik Tiedt Waneta Martins, LCSW

## 2021-12-30 NOTE — Unmapped (Signed)
Cystic Fibrosis Nutrition Assessment    Outpatient, In-person: MD Consult this visit related to cystic fibrosis protocol - annual assessment  Primary Pulmonary Provider: Verlin Fester, PA  ===================================================================  Darlene Sutton is a 31 y.o. female seen for medical nutrition therapy related to Cystic Fibrosis.  ===================================================================  INTERVENTION:    1. Fat-soluble vitamin levels, HbA1c drawn today  - will follow up via MyChart once results obtained    2. Handout for healthy foods to take with Trikafta provided and reviewed    3. Continue miralax to facilitate 1 BM every day/every other day    Outpatient:  Time Spent (minutes): 8  Follow-up will occur per nutrition risk protocol for CF. Next follow-up occurs within one calendar year.  Above interventions and recommendations  will be assessed at time of follow-up.   ===================================================================  ASSESSMENT:  Nutrition Category = Adult CF, Outstanding, BMI >/= 22 kg/m2 for females    Estimated daily needs: 2197 kcal/day, 79-105 g PRO/day, 2418 mL fluid/day      Calories estimated using: Cystic Fibrosis Conference Formula, protein per DRI x 1.5-2, fluid per [facility]    Current diet is appropriate for CF. Current PO intake is adequate to meet estimated CF needs. Patient meeting goals for CF weight management. Enzyme replacement not necessary for this pancreatic sufficient patient. Bowel regimen is appropriate. Patient on CFTR modulator is consuming adequate amounts of fat-containing foods with prescribed medication to optimize absorption.    ASPEN/AND Malnutrition Screening:  Patient does not meet ASPEN/AND criteria for malnutrition at this time.    Nutrition Focused Physical Exam:   Assessment not indicated d/t lack of malnutrition risk factors. No loss noted upon visual examination.    Goals:  1. Met and Ongoing:  Meet estimated daily needs  2. Met and Ongoing:  Reach/maintain established anthropometric goals for Adult CF: BMI >/= 22 kg/m2 for females  3. Met and Ongoing:  Normal fat-soluble vitamin levels: Vitamin A, Vitamin E and PT per lab range; Vitamin D 25OH total >30   4. Met and Ongoing:  Maintain glucose control. Carbohydrate content of diet should comprise 40-50% of total calorie needs, but carbohydrates are not restricted in this population.    5. Met:  Meet sodium needs for CF     Nutrition goals reviewed, and relevant barriers identified and addressed: none evident.   Patient is evaluated to have good  willingness and ability to achieve nutrition goals.   ==================================================================  CLINICAL DATA:  Past Medical History:   Diagnosis Date   ??? Asthma    ??? 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 Pancreatitis, acute      Anthroprometric Evaluation:  Weight changes: weight largely stable over past few months   CFTR modulator and weight change: On Trikafta - started Jan 2022  BMI Readings from Last 3 Encounters:   12/29/21 25.87 kg/m??   09/29/21 25.54 kg/m??   06/30/21 23.77 kg/m??     Wt Readings from Last 3 Encounters:   12/29/21 72.6 kg (160 lb)   09/29/21 71.7 kg (158 lb)   06/30/21 66.7 kg (147 lb)     Ht Readings from Last 3 Encounters:   06/30/21 167.5 cm (5' 5.95)     ==================================================================  Energy Intake (outpatient):   Diet: Darlene Sutton endorses stable appetite and PO intake. Her intake was restrictive at last visit and she has since liberalized her dietary patterns significantly. She still  avoids most red meat and alcohol. She spent much of the summer camping outside and was able to tolerate a wide variety of foods that she previously avoided (ie hot dogs, chicken nuggets). She typically eats 3 meals per day plus snacks.    Allergies, Intolerances, Sensitivities, and/or Cultural/Religious Dietary Restrictions:  No Known Allergies   Sodium in diet: Likely adequate. Informed by GI to consume low sodium foods following pancreatitis episode but has since liberalized her diet.  Calcium in diet:  Adequate from dairy products 3-4 servings daily  Food Insecurity: not endorsed today  CFTR modulator and Diet: Prescribed Trikafta (elexacaftor/tezacaftor/ivacaftor). Adequate fat consumed with dose.    PO Supplements: none  Appetite Stimulant: none  Enteral feeding tube: none  Physical activity: walks or runs regularly    Fat Malabsorption (outpatient):  Enzyme brand, (meals/snacks): PERT not required; pancreatic sufficient  GI meds:  Nutritionally relevant medications reviewed. Metamucil, miralax  Stools (steatorrhea): not greasy, formed, once daily or every other day  Stools (constipation): positive s/sx of constipation. Prescribed bowel regimen as above  GI symptoms:  none. Has not experienced any abdominal discomfort in several months.  Fecal Fat Studies: sufficient  Lab Results   Component Value Date    [id number] 12/28/2020     No results found for: ELAST  No results found for: PELAI    Vitamins/Minerals (outpatient):  CF-specific MVI, dose, compliance: none  Calcium supplement: n/a  Fat-soluble vitamin levels: awaiting results  Lab Results   Component Value Date    VITAMINA 36.4 12/16/2020     No results found for: CRP  Lab Results   Component Value Date    VITDTOTAL 32.6 12/16/2020     Lab Results   Component Value Date    VITAME 7.3 12/16/2020     Lab Results   Component Value Date    PT 12.2 12/29/2021    PT 11.7 12/16/2020     No results found for: DESGCARBPT  No results found for: PIVKAII    Bone Health: normal vitamin D level; adequate calcium intake from diet. Last DEXA (10/22) showed normal bone density for age. Repeat screening 2027.    CF Related Diabetes: no known hx of CFRD; HbA1c WNL   No results found for: GLUF  No results found for: GLUCOSE2HR  Lab Results   Component Value Date    A1C 4.8 12/29/2021    A1C 5.0 12/16/2020       Jackqulyn Livings MPH, RD, LDN  Pager: [phone]

## 2022-01-02 LAB — VITAMIN D 25 HYDROXY: VITAMIN D, TOTAL (25OH): 23.1 ng/mL (ref 20.0–80.0)

## 2022-01-02 LAB — IGE: TOTAL IGE: 636 [IU]/mL — ABNORMAL HIGH

## 2022-01-02 LAB — VITAMIN E: VITAMIN E LEVEL: 6.5 mg/L

## 2022-01-02 LAB — VITAMIN A: VITAMIN A RESULT: 36.2 ug/dL

## 2022-01-02 MED FILL — TRIKAFTA 100-50-75 MG (D)/150 MG (N) TABLETS: 28 days supply | Qty: 84 | Fill #1

## 2022-01-02 MED FILL — ALBUTEROL SULFATE HFA 90 MCG/ACTUATION AEROSOL INHALER: 25 days supply | Qty: 8.5 | Fill #7

## 2022-01-02 NOTE — Unmapped (Addendum)
Goals and plans we discussed today:  Great to see you!  Continue your medications as prescribed  I will let you know about your bloodwork and culture  Come back in 3 months     Thank you for allowing [place] to be a part of your care!     To reach your CF nurse coordinators:    Patients with the last name A-K: Joni Reining [phone]  Patients with the last name L-Z: Harriett Sine [phone]     For urgent issues after hours/weekends:  Hospital Operator: [phone], ask for Pulmonary Fellow on-call     To make or change a clinic appointment:   [facility]: [phone]     --> When you should use MyChart:           - Order a prescription refill          - View test results          - Send a non-urgent message or update to the care team          - View after-visit summaries           - See or pay bills      --> When you should call (NOT use MyChart)           - Increase in cough          - Change in amount of mucus or mucus color           - Coughing up blood or blood-tinged mucus          - Chest pain          - Shortness of breath           - Lack of energy, feeling sick, or increase in tiredness     --> I don't have a MyChart. Why should I get one?           - It's encrypted, so your information is secure          - It's a quick, easy way to contact the care team, manage appointments, see test results, and more!      --> How do I sign-up for MyChart?            - Download the MyChart app from the [employer] or [employer] and sign-up in the app           - Sign-up online at [email or web address]

## 2022-01-03 NOTE — Unmapped (Signed)
Nutrition - Brief: Labs    Aroura's fat-soluble vitamin levels were drawn in clinic last week; results documented below. All are within normal limits with the exception of Vit D, which is below goal of 30. Submitted order to [facility] for Vit D3 2000 units daily. Requested enrollment in healthwell vitamin and supplement grant. Aleathea informed of results and recommendations via MyChart.    LABS   VITAMIN LEVELS / INR:  Lab Results   Component Value Date    VITAMINA 36.2 12/29/2021    VITAMINA 36.4 12/16/2020     No results found for: CRP  Lab Results   Component Value Date    VITDTOTAL 23.1 12/29/2021    VITDTOTAL 32.6 12/16/2020     Lab Results   Component Value Date    VITAME 6.5 12/29/2021    VITAME 7.3 12/16/2020     Lab Results   Component Value Date    PT 12.2 12/29/2021    PT 11.7 12/16/2020     No results found for: DESGCARBPT  No results found for: PIVKAII     Jackqulyn Livings MPH, RD, LDN  Pager: [phone]

## 2022-01-30 NOTE — Unmapped (Signed)
[facility] Refill Coordination Note    Specialty Medication(s) to be Shipped:   CF/Pulmonary: -Trikafta  Other medication(s) to be shipped: No additional medications requested for fill at this time     Darlene Sutton, DOB: February 06, 1991  Phone: [phone] (home)     All above HIPAA information was verified with patient.     Was a Nurse, learning disability used for this call? No    Completed refill call assessment today to schedule patient's medication shipment from the [facility] [phone]).  All relevant notes have been reviewed.     Specialty medication(s) and dose(s) confirmed: Regimen is correct and unchanged.   Changes to medications: Darlene Sutton reports no changes at this time.  Changes to insurance: No  New side effects reported not previously addressed with a pharmacist or physician: None reported  Questions for the pharmacist: No    Confirmed patient received a Conservation officer, historic buildings and a Surveyor, mining with first shipment. The patient will receive a drug information handout for each medication shipped and additional FDA Medication Guides as required.       DISEASE/MEDICATION-SPECIFIC INFORMATION        For CF patients: CF Healthwell Grant Active? Yes, **HWG TX til 11/15/22**    SPECIALTY MEDICATION ADHERENCE     Medication Adherence    Patient reported X missed doses in the last month: 0  Specialty Medication: Trikafta 100-50-75mg   Patient is on additional specialty medications: No  Patient is on more than two specialty medications: No  Informant: patient  Reliability of informant: reliable  Reasons for non-adherence: no problems identified  Confirmed plan for next specialty medication refill: delivery by pharmacy  Refills needed for supportive medications: not needed        Were doses missed due to medication being on hold? No    Trikafta 100-50-75mg  : 7 days of medicine on hand     REFERRAL TO PHARMACIST     Referral to the pharmacist: Not needed    [facility]     Shipping address confirmed in Epic.     Delivery Scheduled: Yes, Expected medication delivery date: 02/08/202.     Medication will be delivered via UPS to the prescription address in Epic WAM.    Darlene Sutton Shared [facility] Specialty Technician

## 2022-02-01 MED FILL — TRIKAFTA 100-50-75 MG (D)/150 MG (N) TABLETS: 28 days supply | Qty: 84 | Fill #2

## 2022-02-06 NOTE — Unmapped (Signed)
[facility]    Home Spirometry Monitoring      02/06/22        I have reviewed 2 home spirometry test(s) over prior calendar month.  Most recent test is normal.  Compared to prior home spirometry values, the most recent test is better. Best in-clinic FEV1 in last year was 3.95 L (115%) on 12/29/21.         Quality of most recent test graded A.     Follow-up plan:   ??? Reached out to patient via MyChart.  ??? Continue monthly home spirometry monitoring for the management of cystic fibrosis.    Durenda Hurt [place], PA-C  [facility]   [phone]

## 2022-02-22 MED ORDER — TRIKAFTA 100-50-75 MG (D)/150 MG (N) TABLETS
ORAL_TABLET | 3 refills | 0 days | Status: CP
Start: 2022-02-22 — End: ?
  Filled 2022-03-01: qty 84, 28d supply, fill #0

## 2022-02-22 MED ORDER — ALBUTEROL SULFATE HFA 90 MCG/ACTUATION AEROSOL INHALER
3 refills | 0 days | Status: CP
Start: 2022-02-22 — End: ?
  Filled 2022-03-28: qty 8.5, 25d supply, fill #0

## 2022-02-27 NOTE — Unmapped (Signed)
[facility] Refill Coordination Note    Specialty Medication(s) to be Shipped:   CF/Pulmonary: -Trikafta  Other medication(s) to be shipped: Hyper-Sal 3.5% neb solu     Darlene Sutton, DOB: [birth year]  Phone: [phone] (home)     All above HIPAA information was verified with patient.     Was a Nurse, learning disability used for this call? No    Completed refill call assessment today to schedule patient's medication shipment from the [facility] [phone]).  All relevant notes have been reviewed.     Specialty medication(s) and dose(s) confirmed: Regimen is correct and unchanged.   Changes to medications: Darlene Sutton reports no changes at this time.  Changes to insurance: No  New side effects reported not previously addressed with a pharmacist or physician: None reported  Questions for the pharmacist: No    Confirmed patient received a Conservation officer, historic buildings and a Surveyor, mining with first shipment. The patient will receive a drug information handout for each medication shipped and additional FDA Medication Guides as required.       DISEASE/MEDICATION-SPECIFIC INFORMATION        For CF patients: CF Healthwell Grant Active? Yes, **HWG TX til 11/15/22**    SPECIALTY MEDICATION ADHERENCE     Medication Adherence    Patient reported X missed doses in the last month: 0  Specialty Medication: Trikafta 100-50-75mg   Patient is on additional specialty medications: No  Patient is on more than two specialty medications: No  Informant: patient  Reliability of informant: reliable  Reasons for non-adherence: no problems identified  Confirmed plan for next specialty medication refill: delivery by pharmacy  Refills needed for supportive medications: not needed        Were doses missed due to medication being on hold? No    Trikafta 100-50-75mg  : 7 days of medicine on hand     REFERRAL TO PHARMACIST     Referral to the pharmacist: Not needed    [facility]     Shipping address confirmed in Epic.     Delivery Scheduled: Yes, Expected medication delivery date: 03/02/2022.     Medication will be delivered via UPS to the prescription address in Epic WAM.    Olando Willems P Wetzel Bjornstad Shared [facility] Specialty Technician

## 2022-03-22 NOTE — Unmapped (Signed)
[facility] Refill Coordination Note    Darlene Sutton, DOB: Mar 13, 1991  Phone: [phone] (home)     All above HIPAA information was verified with patient.     Specialty Rx Medication Refill Questionnaire 03/22/2022   Which Medications would you like refilled and shipped? Trikafta , Albuetrol inhaler   Please list all current allergies: Na   Have you missed any doses in the last 30 days? Yes   If Yes, please choose the number of missed doses below: 0-2   Have you had any changes to your medication(s) since your last refill? No   How many days remaining of each medication do you have at home? 2 weeks   Have you experienced any side effects in the last 30 days? No   Please enter the full address (street address, city, state, zip code) where you would like your medication(s) to be delivered to. [address] , [place] [address]   Please specify on which day you would like your medication(s) to arrive. Note: if you need your medication(s) within 3 days, please call the pharmacy to schedule your order at [phone]  03/29/2022   Has your insurance changed since your last refill? No   Would you like a pharmacist to call you to discuss your medication(s)? No   Do you require a signature for your package? (Note: if we are billing Medicare Part B or your order contains a controlled substance, we will require a signature) No       Completed refill call assessment today to schedule patient's medication shipment from the [facility] [phone]).  All relevant notes have been reviewed.     Confirmed patient received a Conservation officer, historic buildings and a Surveyor, mining with first shipment. The patient will receive a drug information handout for each medication shipped and additional FDA Medication Guides as required.       REFERRAL TO PHARMACIST     Referral to the pharmacist: Not needed    [facility]     Shipping address confirmed in Epic.     Delivery Scheduled: Yes, Expected medication delivery date: 03/29/2022.     Medication will be delivered via UPS to the prescription address in Epic WAM.    Darlene Sutton Shared [facility] Specialty Technician

## 2022-03-28 MED FILL — TRIKAFTA 100-50-75 MG (D)/150 MG (N) TABLETS: 28 days supply | Qty: 84 | Fill #1

## 2022-03-30 ENCOUNTER — Ambulatory Visit: Admit: 2022-03-30 | Discharge: 2022-03-30 | Payer: PRIVATE HEALTH INSURANCE

## 2022-03-30 ENCOUNTER — Ambulatory Visit: Admit: 2022-03-30 | Discharge: 2022-03-30 | Payer: PRIVATE HEALTH INSURANCE | Attending: Medical | Primary: Medical

## 2022-03-30 NOTE — Unmapped (Signed)
Sputum collected per order. Sample labeled in front of patient, after confirming name and DOB. Sample tubed to the lab.    Shelba Flake Gentry Fitz, RN  CF Nurse Coordinator   [phone]

## 2022-03-30 NOTE — Unmapped (Signed)
Adult Cystic Fibrosis Clinic  Yearly Assessment    Current Inhaled Medications:     Albuterol Neb 2.5mg  Daily  Albuterol HFA 26mcg/2puffs PRN  Hypertonic Saline 3% Daily    Home O2: None    Home Spirometer: Has a home spirometer and uses the device once per month.     Review order meds are taken, frequency, compliance: Patient is compliant    Barriers to Compliance and Solutions: n/a    Airway Clearance Used: The patient uses her [place] with the hypertonic saline. Gave a new [place] today.    Exercise: Walks regularly. 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     Airway Clearance/Not effective: n/a    Cough Techniques: n/a    Spacer/MDI training: The patient has a spacer, uses it with the inhaled medications and is aware of the correct technique for use. Also reviewed cleaning of the spacer. Gave a new spacer today.    Bipap/Cpap;Settings if use: n/a    Equipment Review/Cleaning: Boils to sterilize    DME Provider: n/a    Misc. Notes: Gave a new aerobika and spacer. The patient had no further needs or concerns today. Encouraged the patient to reach out should any arise.

## 2022-03-31 DIAGNOSIS — J302 Other seasonal allergic rhinitis: Principal | ICD-10-CM

## 2022-03-31 MED ORDER — FLUTICASONE PROPIONATE 50 MCG/ACTUATION NASAL SPRAY,SUSPENSION
Freq: Two times a day (BID) | NASAL | 11 refills | 60 days | Status: CP
Start: 2022-03-31 — End: 2023-03-31

## 2022-03-31 NOTE — Unmapped (Signed)
Pulmonary Cystic Fibrosis Clinic Note    ASSESSMENT     Darlene Sutton is a 31 y.o. female with cystic fibrosis who presents to the [facility] for a routine follow-up visit. She is doing well on Trikafta, and her FEV1 is stable at 115%. Her allergy symptoms are increased currently, and she is using Claritin and Flonase.     Problem List Items Addressed This Visit        Respiratory    Cystic fibrosis (CMS-HCC) - Primary    Relevant Orders    CF Sputum/ CF Sinus Culture (Completed)       Other    Constipation    Environmental allergies       PLAN     1) Collected CF sputum culture via OP swab   2) Continue Albuterol nebs, then Hypertonic Saline 3% + Aerobika once daily for airway clearance given bronchiectasis; continue Albuterol MDI PRN asthma symptoms; previously discussed option to stop Hypertonic Saline as the Simplify trial showed that CF patients on Trikafta with FEV1 > 70% did not have a decline in lung function after stopping this therapy, but she would like to continue at this time.   3) Continue Trikafta - monitor LFT's yearly (last checked 12/2021)   4) Continue Claritin/Flonase for seasonal/environmental allergies   5) Continue use of home spirometer monthly, before all visits, and as needed when sick   6) For her constipation, continue Miralax daily; advised that she can titrate the dose as needed   7) Patient has received a Flu vaccine and COVID booster this season   8) She is established with a local PCP for women's health     The patient will return to clinic in 3 months, or sooner if clinically indicated.    I personally spent 37 minutes face-to-face and non-face-to-face in the care of this patient, which includes all pre, intra, and post visit time on the date of service.  All documented time was specific to the E/M visit and does not include any procedures that may have been performed.    Karl Ito, [place]  [facility]   [phone]    SUBJECTIVE: Darlene Sutton is a 31 y.o. year old female with cystic fibrosis who comes in today for a routine CF follow-up visit. She reports having some additional GI symptoms last week before she started her period. This included increased gas, bloating and discomfort. She continues to take Miralax daily with her coffee. She sometimes take Citrucel but doesn't like the taste. She typically has a BM daily to every other day. She otherwise denies abdominal pain, nausea, vomiting, diarrhea or constipation.     She has had slightly more allergies lately. Her symptoms include post-nasal drip and itchy eyes. She has been taking Claritin and Flonase. She thinks that Claritin makes her feel more bloated. She has been using her Albuterol and Hypertonic Saline 3% about once daily and slightly more often recently due to the allergies.     She lives alone in [place]. 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 She is going on a cruise for Spring Break coming up soon.     CF Overview:     Genetics: F508del / A455E   CF Modulator Tx: Trikafta - started late January 2022    Background: Diagnosed with CF after being admitted to [facility] for a first time episode of acute pancreatitis in 07/2020 and her CF genetic panel returned  with 2 CF mutations. She had a CT Chest that showed evidence of bronchiectasis. She established care with our clinic in 11/2020 and afterwards started Trikafta.     Airway Clearance Regimen: Waymon Budge       Exercise Habits: Walking     Typical bacteria: MSSA     Last CF sputum culture: 03/30/22  Last AFB culture: 09/29/21    Inhaled ABX: None     Last oral antibiotics: Z-pack (date unknown)   Last IV antibiotics: Never     The patient judges that exacerbations requiring any antibiotic intervention (oral, inhaled, IV) are occurring about <1 times per year.    Hemoptysis: no  ABPA:  no  Ptx:  no  O2 needs:      no  Port:  no  Sinus symptoms (congestion, rhinorrhea, pain) are present and this is felt to be worse than last visit.     Panc Insuf: No (fecal elastase normal at 378 on 12/28/20)   PEG:  no  Supplements: no  DIOS:  no  CF Liver Dz:   no  Patient is having approximately 1 stool daily to every other day.  Stools are described as formed in appearance.     Last Colonoscopy: None (not due until age 78 per CFF guidelines)    Diabetes: no.   Last OGTT: none; (Fasting n/a; 2 hour n/a)   HgbA1C: 12/16/20 (5.0%)    Osteopenia: no   Last DEXA: 09/29/21    Depression: no  Anxiety:  no  Adherence to medications and airway clearance is rated as Excellent.      Medications:  Reviewed with patient and updated in EPIC.  Outpatient Encounter Medications as of 03/30/2022   Medication Sig Dispense Refill   ??? albuterol 2.5 mg /3 mL (0.083 %) nebulizer solution Inhale 1 vial (2.5 mg total) by nebulization daily before hypertonic saline nebs. May also inhale 1 vial (2.5 mg total) every six (6) hours as needed for shortness of breath/wheezing. 270 mL 12   ??? albuterol HFA 90 mcg/actuation inhaler Inhale 2 puffs daily prior to hypertonic saline nebs. May also inhale 2 puffs every six (6) hours as needed for shortness of breath/wheezing. 25.5 g 3   ??? cholecalciferol, vitamin D3-50 mcg, 2,000 unit,, 50 mcg (2,000 unit) cap Take by mouth.     ??? elexacaftor-tezacaftor-ivacaft (TRIKAFTA) 100-50-75 mg(d) /150 mg (n) tablet Take 2 Tablets (Elexacaftor 100mg /Tezacaftor 50mg /Ivacaftor 75mg ) by mouth in the morning and 1 tablet (ivacaftor 150mg ) in the evening with fatty food 252 tablet 3   ??? loratadine (CLARITIN) 10 mg tablet Take 1 tablet (10 mg total) by mouth daily as needed.     ??? methylcellulose (CITRUCEL, SUCROSE,) oral powder      ??? polyethylene glycol (GLYCOLAX) 17 gram/dose powder Take 17 g by mouth daily.     ??? sodium chloride 3.5 % Nebu Inhale 1 vial (4ml) by nebulization once daily. 240 mL 5   ??? [DISCONTINUED] fluticasone propionate (FLONASE) 50 mcg/actuation nasal spray 1 spray into each nostril two (2) times a day. 16 g 11 No facility-administered encounter medications on file as of 03/30/2022.       Allergies:  No Known Allergies    Past Medical History:  Past Medical History:   Diagnosis Date   ??? Asthma    ??? 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 Pancreatitis, acute        Past Surgical  History:  No past surgical history on file.    Social History:  Social History     Socioeconomic History   ??? Marital status: Single     Spouse name: None   ??? Number of children: None   ??? Years of education: None   ??? Highest education level: None   Occupational History   ??? Occupation: Runner, broadcasting/film/video / Therapist, sports: [employer]   Tobacco Use   ??? Smoking status: Never   ??? Smokeless tobacco: Never       Family History:  Family History   Problem Relation Age of Onset   ??? No Known Problems Mother    ??? No Known Problems Father    ??? No Known Problems Sister    ??? No Known Problems Sister    ??? No Known Problems Brother    ??? Asthma Brother    ??? Cystic fibrosis Paternal Uncle        Review of Systems:  A 12 point review of systems was negative except for pertinent items noted in the HPI.        OBJECTIVE:   Objective   Physical exam:   Vitals:    03/30/22 1436   BP: 116/72   Pulse: 101   Temp: 36.7 ??C (98 ??F)   TempSrc: Temporal   SpO2: 98%   Weight: 72.1 kg (159 lb)   Height: 167.6 cm (5' 6)     Body mass index is 25.66 kg/m??.  Wt Readings from Last 3 Encounters:   03/30/22 72.1 kg (159 lb)   12/29/21 72.6 kg (160 lb)   09/29/21 71.7 kg (158 lb)       GEN: Cooperative female, sitting up on exam table, NAD  HEAD: Normocephalic, atraumatic  EYES: PERRLA, anicteric sclerae, conjuctiva clear  EARS: TM's are normal bilaterally  NOSE: Nares and mucosa normal with midline septum, no drainage or sinus tenderness.  OROPHARYNX: Pink and moist without erythema or exudate  NECK: Supple, trachea midline  LYMPH: No palpable lymphadenopathy   HEART/CV: RRR, S1, S2 nl, no MRG  LUNGS: CTA bilaterally, no crackles or wheezes, normal WOB on RA  ABD: NABS, soft, NT/ND, no rebound or guarding, no masses, no hepatomegaly noted   EXT: No cyanosis, clubbing, or edema  SKIN: No rashes or lesions noted  NEURO: No focal deficits noted  PSYCH: Awake, alert, and interactive. Mood and affect appropriate.     Pulmonary Function Testing Results:  03/30/22      Interpretation: Spirometry is normal and FEV1 unchanged from last visit.     Performed on 12/06/20 at [facility].   Results per CareEverywhere note:   FVC-Pre L 4.01   FVC-Predicted Pre % 99   FVC-Post L 3.97   FVC-Predicted Post % 98   Pre FEV1/FVC % % 83   Post FEV1/FCV % % 85   FEV1-Pre L 3.32   FEV1-Predicted Pre % 97   FEV1-Post L 3.39   DLCO uncorrected ml/min/mmHg 25.04   DLCO [place]% % 104   DLCO corrected ml/min/mmHg 25.04   DLCO COR %Predicted % 104   DLVA Predicted % 108   TLC L 5.95   TLC % Predicted % 111   RV % Predicted % 140     I reviewed interval medical records.    CF Monitoring:     Pertinent Laboratory Data:    CBC  Lab Results   Component Value Date    WBC  3.9 12/29/2021    HGB 12.8 12/29/2021    HCT 37.1 12/29/2021    PLT 299 12/29/2021       ELECTROLYTES  Lab Results   Component Value Date    Sodium 137 12/29/2021     Lab Results   Component Value Date    Potassium 4.1 12/29/2021     Lab Results   Component Value Date    Chloride 104 12/29/2021     Lab Results   Component Value Date    CO2 23.8 12/29/2021     Lab Results   Component Value Date    BUN 11 12/29/2021     Lab Results   Component Value Date    Creatinine 0.71 12/29/2021     Lab Results   Component Value Date    Glucose 96 12/29/2021     Lab Results   Component Value Date    Calcium 10.0 12/29/2021     Lab Results   Component Value Date    Anion Gap 9 12/29/2021       GI  Lab Results   Component Value Date    AST 20 12/29/2021    AST 42 (H) 04/15/2021     Lab Results   Component Value Date    ALT 16 12/29/2021    ALT 46 (H) 04/15/2021     Lab Results   Component Value Date    Alkaline Phosphatase 55 12/29/2021    Alkaline Phosphatase 78 04/15/2021     Lab Results   Component Value Date    Total Protein 7.7 12/29/2021    Total Protein 7.2 04/15/2021     Lab Results   Component Value Date    Albumin 4.1 12/29/2021     Lab Results   Component Value Date    GGT <7 12/16/2020     Lab Results   Component Value Date    Total Bilirubin 0.6 12/29/2021    Total Bilirubin 0.3 04/15/2021       VITAMIN LEVELS / INR:    Lab Results   Component Value Date    Vitamin A 36.2 12/29/2021     No results found for: VITD2  No results found for: VITD3  Lab Results   Component Value Date    Vitamin D Total (25OH) 23.1 12/29/2021     Lab Results   Component Value Date    Vitamin E 6.5 12/29/2021       Lab Results   Component Value Date    PT 12.2 12/29/2021     Lab Results   Component Value Date    INR 1.08 12/29/2021       IGE  Lab Results   Component Value Date    IgE, Total 636 (H) 12/29/2021    IgE, Total 1,041 (H) 12/16/2020       SPUTUM CULTURES  CF Sputum Culture (no units)   Date Value   03/30/2022 <1+ Staphylococcus aureus (A)   03/30/2022 4+ Oropharyngeal Flora Isolated     AFB Smear (no units)   Date Value   09/29/2021     NO ACID FAST BACILLI SEEN- 3 negative smears do not exclude pulmonary TB. If active pulmonary TB is suspected, continue airborne isolation until pulmonary disease is excluded by negative cultures.   12/16/2020     NO ACID FAST BACILLI SEEN- 3 negative smears do not exclude pulmonary TB. If active pulmonary TB is suspected, continue airborne isolation until pulmonary disease is excluded by negative  cultures.     AFB Culture (no units)   Date Value   09/29/2021 No Acid Fast Bacilli Detected   12/16/2020 No Acid Fast Bacilli Detected       Pertinent Imaging Data:  CT Chest (10/15/20): Completed at [facility].   Impression:   1. Areas of bronchiectasis, bronchial wall thickening and cystic change with upper lobe predominance, pattern of disease that could   be seen in the setting of cystic fibrosis. Atypical infection could have a similar appearance.   2. Tree-in-bud opacities in the superior segment of the RIGHT lower lobe and scattered small pulmonary nodules about the periphery of   the RIGHT upper lobe. Correlate with any respiratory symptoms.??No consolidation.   3. Incidental imaging of upper abdominal contents shows indistinct boundaries of the pancreas perhaps related to prior pancreatitis. No   overt edema in the upper abdomen. Low-dose nature of the scan also limits detail in these areas, correlate with any ongoing abdominal   symptoms.     MRCP (10/12/20): Completed at [facility]   Impression:   1. No specific pancreatic abnormality to determine etiology of pancreatitis. Subtle indistinct margin through the neck and head of   the pancreas.??Pancreatic duct is not identified.   2. No secondary signs of autoimmune pancreatitis.   3. Normal common bile duct. ??No cholelithiasis.   4. No evidence of acute pancreatic inflammation.     CT Abd/Pelvis (07/29/20): Completed at [facility]  Impression:   1. Changes of acute??interstitial edematous pancreatitis. No localized fluid collections and no ductal dilatation.   2. Small amount of free fluid in the pelvis could be physiologic or reactive related to the pancreatitis.     Other Health Maintenance:   Vaccines:   Immunization History   Administered Date(s) Administered   ??? COVID-19 VAC,BIVALENT(33YR UP)BOOST,PFIZER 09/29/2021   ??? COVID-19 VACC,MRNA,(PFIZER)(PF) 02/21/2020, 03/16/2020, 10/15/2020   ??? DTaP 12/28/1992, 02/17/1993, 04/28/1993, 05/01/1994, 10/27/1996   ??? HPV Quadrivalent (Gardasil) 06/02/2009, 09/17/2009, 06/10/2010   ??? Hepatitis A Vaccine Pediatric / Adolescent 2 Dose IM 11/14/2006, 06/02/2009   ??? Hepatitis B vaccine, pediatric/adolescent dosage, 07/09/92, 12/28/1992, 07/28/1993   ??? HiB-PRP-T 12/28/1992, 02/17/1993, 04/28/1993, 02/02/1994   ??? Influenza Recomb PF (Quad) Injectable(Egg Free)18+ 09/02/2020   ??? Influenza Virus Vaccine, unspecified formulation 09/05/2020   ??? MMR 02/02/1994, 10/27/1996   ??? Meningococcal Conjugate MCV4P 06/10/2009   ??? PPD Test 04/14/2014   ??? Poliovirus,inactivated (IPV) 12/28/1992, 03/17/1993, 07/28/1993, 05/01/1994, 10/27/1996   ??? TdaP 06/10/2010, 09/10/2020   ??? Tetanus-Diptheria Toxoids-TD(TDVAX),Asdorbed,2LF(IM) 02/17/2005   ??? Varicella 11/06/1994, 11/14/2006

## 2022-04-04 NOTE — Unmapped (Signed)
Thank you for allowing [place] to be a part of your care    Goals and plans we discussed today:  Great to see you!  Have a great trip  Come back to see [place] in 3 months     To reach your CF nurse coordinators:  Patients with the last name A-K: Darlene Sutton [phone]  Patients with the last name L-Z: Darlene Sutton [phone]     For urgent issues after hours/weekends:  Hospital Operator: [phone]) [phone], ask for Pulmonary Fellow on-call     To make or change a clinic appointment:   [facility]: [phone]     When you should use MyChart:   View test results  Send a non-urgent message or update to the care team  View after-visit summaries   See or pay bills      When you should call your nurse coordinator (NOT use MyChart)   Feeling sick or have a fever   Increase in cough   Change in amount of mucus or change mucus color/thickness  Coughing up blood or blood-tinged mucus  Chest pain  Shortness of breath   Lack of energy/Poor Appetite

## 2022-04-14 NOTE — Unmapped (Signed)
[facility]    Home Spirometry Monitoring      04/14/22        I have reviewed 1 home spirometry test(s) over prior calendar month.  Most recent test is normal.  Compared to prior home spirometry values, the most recent test is worse. Best in-clinic FEV1 in last year was 3.95 L (115%) on 12/29/21.         Quality of most recent test graded A.     Follow-up plan:   ??? Reached out to patient via MyChart. Decrease in FEV1 is likely related to technique or lack of inhaled bronchodilator before performing test.   ??? Continue monthly home spirometry monitoring for the management of cystic fibrosis.  ??? Recommendations: Continue routine monitoring    Mirian Mo  [facility]   [phone]

## 2022-04-19 DIAGNOSIS — J302 Other seasonal allergic rhinitis: Principal | ICD-10-CM

## 2022-04-19 DIAGNOSIS — J4530 Mild persistent asthma, uncomplicated: Principal | ICD-10-CM

## 2022-04-19 MED ORDER — QVAR REDIHALER 40 MCG/ACTUATION HFA BREATH ACTIVATED AEROSOL
Freq: Two times a day (BID) | RESPIRATORY_TRACT | 1 refills | 0 days | Status: CP
Start: 2022-04-19 — End: ?

## 2022-04-19 MED ORDER — LEVOCETIRIZINE 5 MG TABLET
ORAL_TABLET | Freq: Every evening | ORAL | 1 refills | 30 days | Status: CP
Start: 2022-04-19 — End: 2023-04-19

## 2022-04-20 NOTE — Unmapped (Signed)
[facility] Refill Coordination Note    Darlene Sutton, DOB: 1991/12/12  Phone: [phone] (home)       All above HIPAA information was verified with patient.     Specialty Rx Medication Refill Questionnaire 04/19/2022   Which Medications would you like refilled and shipped? Trikafta 100-50-75mg  Tab, , Albuterol inhaler , Hyper-Sal 3.5% Neb Solu.   Please list all current allergies: Na   Have you missed any doses in the last 30 days? No   If Yes, please choose the number of missed doses below: -   Have you had any changes to your medication(s) since your last refill? No   How many days remaining of each medication do you have at home? 2 weeks   Have you experienced any side effects in the last 30 days? No   Please enter the full address (street address, city, state, zip code) where you would like your medication(s) to be delivered to. [address] , [place] , [address]   Please specify on which day you would like your medication(s) to arrive. Note: if you need your medication(s) within 3 days, please call the pharmacy to schedule your order at [phone]  04/26/2022   Has your insurance changed since your last refill? No   Would you like a pharmacist to call you to discuss your medication(s)? No   Do you require a signature for your package? (Note: if we are billing Medicare Part B or your order contains a controlled substance, we will require a signature) No     Completed refill call assessment today to schedule patient's medication shipment from the [facility] [phone]).  All relevant notes have been reviewed.     Confirmed patient received a Conservation officer, historic buildings and a Surveyor, mining with first shipment. The patient will receive a drug information handout for each medication shipped and additional FDA Medication Guides as required.       REFERRAL TO PHARMACIST     Referral to the pharmacist: Not needed    [facility]     Shipping address confirmed in Epic. Delivery Scheduled: Yes, Expected medication delivery date: 04/26/2022.     Medication will be delivered via UPS to the prescription address in Epic WAM.    Kailee Essman P Wetzel [place] [facility] Specialty Technician

## 2022-04-24 DIAGNOSIS — J4530 Mild persistent asthma, uncomplicated: Principal | ICD-10-CM

## 2022-04-24 MED ORDER — FLUTICASONE PROPIONATE 44 MCG/ACTUATION HFA AEROSOL INHALER
Freq: Two times a day (BID) | RESPIRATORY_TRACT | 0 refills | 0 days | Status: CP
Start: 2022-04-24 — End: 2023-04-24

## 2022-04-25 MED FILL — TRIKAFTA 100-50-75 MG (D)/150 MG (N) TABLETS: 28 days supply | Qty: 84 | Fill #2

## 2022-04-25 MED FILL — HYPER-SAL 3.5 % SOLUTION FOR NEBULIZATION: RESPIRATORY_TRACT | 60 days supply | Qty: 240 | Fill #1

## 2022-04-25 MED FILL — ALBUTEROL SULFATE HFA 90 MCG/ACTUATION AEROSOL INHALER: 25 days supply | Qty: 8.5 | Fill #1

## 2022-05-24 NOTE — Unmapped (Signed)
[facility] Clinical Assessment & Refill Coordination Note    Darlene Sutton, DOB: 09-21-1991  Phone: [phone] (home)     All above HIPAA information was verified with patient.     Was a Nurse, learning disability used for this call? No    Specialty Medication(s):   CF/Pulmonary/Asthma: -Trikafta 100-50-75     Current Outpatient Medications   Medication Sig Dispense Refill    albuterol 2.5 mg /3 mL (0.083 %) nebulizer solution Inhale 1 vial (2.5 mg total) by nebulization daily before hypertonic saline nebs. May also inhale 1 vial (2.5 mg total) every six (6) hours as needed for shortness of breath/wheezing. 270 mL 12    albuterol HFA 90 mcg/actuation inhaler Inhale 2 puffs daily prior to hypertonic saline nebs. May also inhale 2 puffs every six (6) hours as needed for shortness of breath/wheezing. 25.5 g 3    cholecalciferol, vitamin D3-50 mcg, 2,000 unit,, 50 mcg (2,000 unit) cap Take by mouth.      elexacaftor-tezacaftor-ivacaft (TRIKAFTA) 100-50-75 mg(d) /150 mg (n) tablet Take 2 Tablets (Elexacaftor 100mg /Tezacaftor 50mg /Ivacaftor 75mg ) by mouth in the morning and 1 tablet (ivacaftor 150mg ) in the evening with fatty food 252 tablet 3    fluticasone propionate (FLONASE) 50 mcg/actuation nasal spray 1 spray into each nostril two (2) times a day. 16 g 11    fluticasone propionate (FLOVENT HFA) 44 mcg/actuation inhaler Inhale 2 puffs Two (2) times a day. Use with spacer. 10.6 g 0    levocetirizine (XYZAL) 5 MG tablet Take 1 tablet (5 mg total) by mouth every evening. 30 tablet 1    loratadine (CLARITIN) 10 mg tablet Take 1 tablet (10 mg total) by mouth daily as needed.      methylcellulose (CITRUCEL, SUCROSE,) oral powder       polyethylene glycol (GLYCOLAX) 17 gram/dose powder Take 17 g by mouth daily.      sodium chloride 3.5 % Nebu Inhale 1 vial (4ml) by nebulization once daily. 240 mL 5     No current facility-administered medications for this visit.        Changes to medications: Clarine reports no changes at this time.    No Known Allergies    Changes to allergies: No    SPECIALTY MEDICATION ADHERENCE     Trikafta 100-50-75 mg: 10 days of medicine on hand     Medication Adherence    Patient reported X missed doses in the last month: 0  Specialty Medication: Triakfta 100-50-75mg   Patient is on additional specialty medications: No  Informant: patient          Specialty medication(s) dose(s) confirmed: Regimen is correct and unchanged.     Are there any concerns with adherence? No    Adherence counseling provided? Not needed    CLINICAL MANAGEMENT AND INTERVENTION      Clinical Benefit Assessment:    Do you feel the medicine is effective or helping your condition? Yes    Clinical Benefit counseling provided? Progress note from 4/6 shows evidence of clinical benefit    Adverse Effects Assessment:    Are you experiencing any side effects? No    Are you experiencing difficulty administering your medicine? No    Quality of Life Assessment:    Quality of Life    Rheumatology  Oncology  Dermatology  Cystic Fibrosis  1. What impact has your specialty medication had on your ability to engage in physical activity?: Some  2. What impact has our specialty pharmacy medication management  service had on the burden of your treatment regimen in your daily life?: Tremendous          How many days over the past month did your cystic fibrosis  keep you from your normal activities? For example, brushing your teeth or getting up in the morning. 0    Have you discussed this with your provider? Not needed    Acute Infection Status:    Acute infections noted within Epic:  CF Patient  Patient reported infection: None    Therapy Appropriateness:    Is therapy appropriate and patient progressing towards therapeutic goals? Yes, therapy is appropriate and should be continued    DISEASE/MEDICATION-SPECIFIC INFORMATION      For CF patients: CF [facility] Active? Yes    PATIENT SPECIFIC NEEDS     Does the patient have any physical, cognitive, or cultural barriers? No    Is the patient high risk? No    Does the patient require a Care Management Plan? No     SOCIAL DETERMINANTS OF HEALTH     At the [facility], we have learned that life circumstances - like trouble affording food, housing, utilities, or transportation can affect the health of many of our patients.   That is why we wanted to ask: are you currently experiencing any life circumstances that are negatively impacting your health and/or quality of life? Patient declined to answer    Social Determinants of Health     Financial Resource Strain: Not on file   Internet Connectivity: Not on file   Food Insecurity: Not on file   Tobacco Use: Low Risk     Smoking Tobacco Use: Never    Smokeless Tobacco Use: Never    Passive Exposure: Not on file   Housing/Utilities: Not on file   Alcohol Use: Not At Risk    How often do you have a drink containing alcohol?: Never    How many drinks containing alcohol do you have on a typical day when you are drinking?: 1 - 2    How often do you have 5 or more drinks on one occasion?: Never   Transportation Needs: Not on file   Substance Use: Low Risk     Taken prescription drugs for non-medical reasons: Never    Taken illegal drugs: Never    Patient indicated they have taken drugs in the past year for non-medical reasons: Yes, [positive answer(s)]: Not on file   Health Literacy: Not on file   Physical Activity: Not on file   Interpersonal Safety: Not on file   Stress: Not on file   Intimate Partner Violence: Not on file   Depression: Not at risk    PHQ-2 Score: 0   Social Connections: Not on file       Would you be willing to receive help with any of the needs that you have identified today? Not applicable       SHIPPING     Specialty Medication(s) to be Shipped:   CF/Pulmonary/Asthma: -Trikafta 100-50-75    Other medication(s) to be shipped:  albuterol  90 mcg/act inhaler     Changes to insurance: No    Delivery Scheduled: Yes, Expected medication delivery date: 05/30/22.     Medication will be delivered via UPS to the confirmed prescription address in [facility].    The patient will receive a drug information handout for each medication shipped and additional FDA Medication Guides as required.  Verified that patient has previously received  a Conservation officer, historic buildings and a Surveyor, mining.    The patient or caregiver noted above participated in the development of this care plan and knows that they can request review of or adjustments to the care plan at any time.      All of the patient's questions and concerns have been addressed.    [name]   [facility] Specialty Pharmacist

## 2022-05-29 MED FILL — TRIKAFTA 100-50-75 MG (D)/150 MG (N) TABLETS: 84 days supply | Qty: 252 | Fill #3

## 2022-05-29 MED FILL — ALBUTEROL SULFATE HFA 90 MCG/ACTUATION AEROSOL INHALER: 25 days supply | Qty: 8.5 | Fill #2

## 2022-06-05 DIAGNOSIS — J302 Other seasonal allergic rhinitis: Principal | ICD-10-CM

## 2022-06-05 MED ORDER — LEVOCETIRIZINE 5 MG TABLET
ORAL_TABLET | Freq: Every evening | ORAL | 3 refills | 90 days | Status: CP
Start: 2022-06-05 — End: 2023-06-05

## 2022-06-19 DIAGNOSIS — J4530 Mild persistent asthma, uncomplicated: Principal | ICD-10-CM

## 2022-06-19 MED ORDER — FLOVENT HFA 44 MCG/ACTUATION AEROSOL INHALER
3 refills | 0 days | Status: CP
Start: 2022-06-19 — End: ?

## 2022-06-26 NOTE — Unmapped (Signed)
[facility]    Home Spirometry Monitoring      06/26/22        I have reviewed 1 home spirometry test(s) over prior calendar month.  Most recent test is normal.  Compared to prior home spirometry values, the most recent test is better. Best in-clinic FEV1 in last year was 3.97 L (116%) on 03/30/22.         Quality of most recent test graded A.     Follow-up plan:   ??? Reached out to patient via MyChart.  ??? Continue monthly home spirometry monitoring for the management of cystic fibrosis.    Karl Ito, [place]  [facility]   [phone]

## 2022-07-06 ENCOUNTER — Ambulatory Visit: Admit: 2022-07-06 | Discharge: 2022-07-06 | Payer: PRIVATE HEALTH INSURANCE | Attending: Medical | Primary: Medical

## 2022-07-06 ENCOUNTER — Ambulatory Visit: Admit: 2022-07-06 | Discharge: 2022-07-06 | Payer: PRIVATE HEALTH INSURANCE

## 2022-07-06 NOTE — Unmapped (Addendum)
Thank you for allowing [place] to be a part of your care    Goals and plans we discussed today:  Great to see you  Continue your home CF medications  Come back to see [place] in 3 months     To reach your CF nurse coordinators:  Patients with the last name A-K: Joni Reining [phone]  Patients with the last name L-Z: Harriett Sine [phone]     For urgent issues after hours/weekends:  Hospital Operator: [phone]) [phone], ask for Pulmonary Fellow on-call     To make or change a clinic appointment:   [facility]: [phone]     When you should use MyChart:   View test results  Send a non-urgent message or update to the care team  View after-visit summaries   See or pay bills      When you should call your nurse coordinator (NOT use MyChart)   Feeling sick or have a fever   Increase in cough   Change in amount of mucus or change mucus color/thickness  Coughing up blood or blood-tinged mucus  Chest pain  Shortness of breath   Lack of energy/Poor Appetite

## 2022-07-06 NOTE — Unmapped (Unsigned)
Sputum for CF and AFB collected per order. Sample labeled and tubed to the lab.    Shelba Flake Gentry Fitz, RN  CF Nurse Coordinator   [phone]

## 2022-07-06 NOTE — Unmapped (Signed)
Pulmonary Cystic Fibrosis Clinic Note    ASSESSMENT     Darlene Sutton is a 31 y.o. female with cystic fibrosis who presents to the [facility] for a routine follow-up visit. She is doing well on Trikafta, and her FEV1 is stable at 115%. Her allergy symptoms are increased currently, and she is using Claritin and Flonase.     Problem List Items Addressed This Visit    None        PLAN     1) Collected CF sputum culture via OP swab   2) Continue Albuterol nebs, then Hypertonic Saline 3% + Aerobika once daily for airway clearance given bronchiectasis; continue Albuterol MDI PRN asthma symptoms; previously discussed option to stop Hypertonic Saline as the Simplify trial showed that CF patients on Trikafta with FEV1 > 70% did not have a decline in lung function after stopping this therapy, but she would like to continue at this time.   3) Continue Trikafta - monitor LFT's yearly (last checked 12/2021)   4) Continue Claritin/Flonase for seasonal/environmental allergies   5) Continue use of home spirometer monthly, before all visits, and as needed when sick   6) For her constipation, continue Miralax daily; advised that she can titrate the dose as needed   7) Patient has received a Flu vaccine and COVID booster this season   8) She is established with a local PCP for women's health     The patient will return to clinic in 3 months, or sooner if clinically indicated.    I personally spent 37 minutes face-to-face and non-face-to-face in the care of this patient, which includes all pre, intra, and post visit time on the date of service.  All documented time was specific to the E/M visit and does not include any procedures that may have been performed.    Karl Ito, [place]  [facility]   [phone]    SUBJECTIVE:        Darlene Sutton is a 31 y.o. year old female with cystic fibrosis who comes in today for a routine CF follow-up visit. She reports having some additional GI symptoms last week before she started her period. This included increased gas, bloating and discomfort. She continues to take Miralax daily with her coffee. She sometimes take Citrucel but doesn't like the taste. She typically has a BM daily to every other day. She otherwise denies abdominal pain, nausea, vomiting, diarrhea or constipation.     She has had slightly more allergies lately. Her symptoms include post-nasal drip and itchy eyes. She has been taking Claritin and Flonase. She thinks that Claritin makes her feel more bloated. She has been using her Albuterol and Hypertonic Saline 3% about once daily and slightly more often recently due to the allergies.     She lives alone in [place]. 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 She is going on a cruise for Spring Break coming up soon.     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 She had a CT Chest that showed evidence of bronchiectasis. She established care with our clinic in 11/2020 and afterwards started Trikafta.     Airway Clearance Regimen: Waymon Budge  Exercise Habits: Walking     Typical bacteria: MSSA     Last CF sputum culture: 03/30/22  Last AFB culture: 09/29/21    Inhaled ABX: None     Last oral antibiotics: Z-pack (date unknown)   Last IV antibiotics: Never     The patient judges that exacerbations requiring any antibiotic intervention (oral, inhaled, IV) are occurring about <1 times per year.    Hemoptysis: no  ABPA:  no  Ptx:  no  O2 needs:      no  Port:  no  Sinus symptoms (congestion, rhinorrhea, pain) are present and this is felt to be worse than last visit.     Panc Insuf: No (fecal elastase normal at 378 on 12/28/20)   PEG:  no  Supplements: no  DIOS:  no  CF Liver Dz:   no  Patient is having approximately 1 stool daily to every other day.  Stools are described as formed in appearance.     Last Colonoscopy: None (not due until age 45 per CFF guidelines)    Diabetes: no.   Last OGTT: none; (Fasting n/a; 2 hour n/a)   HgbA1C: 12/16/20 (5.0%)    Osteopenia: no   Last DEXA: 09/29/21    Depression: no  Anxiety:  no  Adherence to medications and airway clearance is rated as Excellent.      Medications:  Reviewed with patient and updated in EPIC.  Outpatient Encounter Medications as of 07/06/2022   Medication Sig Dispense Refill    albuterol 2.5 mg /3 mL (0.083 %) nebulizer solution Inhale 1 vial (2.5 mg total) by nebulization daily before hypertonic saline nebs. May also inhale 1 vial (2.5 mg total) every six (6) hours as needed for shortness of breath/wheezing. 270 mL 12    albuterol HFA 90 mcg/actuation inhaler Inhale 2 puffs daily prior to hypertonic saline nebs. May also inhale 2 puffs every six (6) hours as needed for shortness of breath/wheezing. 25.5 g 3    cholecalciferol, vitamin D3-50 mcg, 2,000 unit,, 50 mcg (2,000 unit) cap Take by mouth.      elexacaftor-tezacaftor-ivacaft (TRIKAFTA) 100-50-75 mg(d) /150 mg (n) tablet Take 2 Tablets (Elexacaftor 100mg /Tezacaftor 50mg /Ivacaftor 75mg ) by mouth in the morning and 1 tablet (ivacaftor 150mg ) in the evening with fatty food 252 tablet 3    fluticasone propionate (FLONASE) 50 mcg/actuation nasal spray 1 spray into each nostril two (2) times a day. 16 g 11    fluticasone propionate (FLOVENT HFA) 44 mcg/actuation inhaler INHALE 2 puffs BY MOUTH TWICE DAILY. USE with spacer 10.6 g 3    levocetirizine (XYZAL) 5 MG tablet Take 1 tablet (5 mg total) by mouth every evening. 90 tablet 3    loratadine (CLARITIN) 10 mg tablet Take 1 tablet (10 mg total) by mouth daily as needed.      methylcellulose (CITRUCEL, SUCROSE,) oral powder       polyethylene glycol (GLYCOLAX) 17 gram/dose powder Take 17 g by mouth daily.      sodium chloride 3.5 % Nebu Inhale 1 vial (4ml) by nebulization once daily. 240 mL 5     No facility-administered encounter medications on file as of 07/06/2022.       Allergies:  No Known Allergies    Past Medical History:  Past Medical History:   Diagnosis Date    Asthma     Cystic fibrosis (CMS-HCC) 2021    Diagnosed with CF (F508del/A455E) after bout of acute pancreatitis     Pancreatitis, acute  Past Surgical History:  No past surgical history on file.    Social History:  Social History     Socioeconomic History    Marital status: Single   Occupational History    Occupation: Runner, broadcasting/film/video / Therapist, sports: Runner, broadcasting/film/video   Tobacco Use    Smoking status: Never    Smokeless tobacco: Never       Family History:  Family History   Problem Relation Age of Onset    No Known Problems Mother     No Known Problems Father     No Known Problems Sister     No Known Problems Sister     No Known Problems Brother     Asthma Brother     Cystic fibrosis Paternal Uncle        Review of Systems:  A 12 point review of systems was negative except for pertinent items noted in the HPI.        OBJECTIVE:   Objective   Physical exam:   There were no vitals filed for this visit.    There is no height or weight on file to calculate BMI.  Wt Readings from Last 3 Encounters:   03/30/22 72.1 kg (159 lb)   12/29/21 72.6 kg (160 lb)   09/29/21 71.7 kg (158 lb)       GEN: Cooperative female, sitting up on exam table, NAD  HEAD: Normocephalic, atraumatic  EYES: PERRLA, anicteric sclerae, conjuctiva clear  EARS: TM's are normal bilaterally  NOSE: Nares and mucosa normal with midline septum, no drainage or sinus tenderness.  OROPHARYNX: Pink and moist without erythema or exudate  NECK: Supple, trachea midline  LYMPH: No palpable lymphadenopathy   HEART/CV: RRR, S1, S2 nl, no MRG  LUNGS: CTA bilaterally, no crackles or wheezes, normal WOB on RA  ABD: NABS, soft, NT/ND, no rebound or guarding, no masses, no hepatomegaly noted   EXT: No cyanosis, clubbing, or edema  SKIN: No rashes or lesions noted  NEURO: No focal deficits noted  PSYCH: Awake, alert, and interactive. Mood and affect appropriate.     Pulmonary Function Testing Results:  03/30/22      Interpretation: Spirometry is normal and FEV1 unchanged from last visit.     Performed on 12/06/20 at [facility].   Results per CareEverywhere note:   FVC-Pre L 4.01   FVC-Predicted Pre % 99   FVC-Post L 3.97   FVC-Predicted Post % 98   Pre FEV1/FVC % % 83   Post FEV1/FCV % % 85   FEV1-Pre L 3.32   FEV1-Predicted Pre % 97   FEV1-Post L 3.39   DLCO uncorrected ml/min/mmHg 25.04   DLCO [place]% % 104   DLCO corrected ml/min/mmHg 25.04   DLCO COR %Predicted % 104   DLVA Predicted % 108   TLC L 5.95   TLC % Predicted % 111   RV % Predicted % 140     I reviewed interval medical records.    CF Monitoring:     Pertinent Laboratory Data:    CBC  Lab Results   Component Value Date    WBC 3.9 12/29/2021    HGB 12.8 12/29/2021    HCT 37.1 12/29/2021    PLT 299 12/29/2021       ELECTROLYTES  Lab Results   Component Value Date    Sodium 137 12/29/2021     Lab Results   Component Value Date    Potassium 4.1  12/29/2021     Lab Results   Component Value Date    Chloride 104 12/29/2021     Lab Results   Component Value Date    CO2 23.8 12/29/2021     Lab Results   Component Value Date    BUN 11 12/29/2021     Lab Results   Component Value Date    Creatinine 0.71 12/29/2021     Lab Results   Component Value Date    Glucose 96 12/29/2021     Lab Results   Component Value Date    Calcium 10.0 12/29/2021     Lab Results   Component Value Date    Anion Gap 9 12/29/2021       GI  Lab Results   Component Value Date    AST 20 12/29/2021    AST 42 (H) 04/15/2021     Lab Results   Component Value Date    ALT 16 12/29/2021    ALT 46 (H) 04/15/2021     Lab Results   Component Value Date    Alkaline Phosphatase 55 12/29/2021    Alkaline Phosphatase 78 04/15/2021     Lab Results   Component Value Date    Total Protein 7.7 12/29/2021    Total Protein 7.2 04/15/2021 Lab Results   Component Value Date    Albumin 4.1 12/29/2021     Lab Results   Component Value Date    GGT <7 12/16/2020     Lab Results   Component Value Date    Total Bilirubin 0.6 12/29/2021    Total Bilirubin 0.3 04/15/2021       VITAMIN LEVELS / INR:    Lab Results   Component Value Date    Vitamin A 36.2 12/29/2021     No results found for: VITD2  No results found for: VITD3  Lab Results   Component Value Date    Vitamin D Total (25OH) 23.1 12/29/2021     Lab Results   Component Value Date    Vitamin E 6.5 12/29/2021       Lab Results   Component Value Date    PT 12.2 12/29/2021     Lab Results   Component Value Date    INR 1.08 12/29/2021       IGE  Lab Results   Component Value Date    IgE, Total 636 (H) 12/29/2021    IgE, Total 1,041 (H) 12/16/2020       SPUTUM CULTURES  CF Sputum Culture (no units)   Date Value   03/30/2022 <1+ Methicillin-Susceptible Staphylococcus aureus (A)   03/30/2022 4+ Oropharyngeal Flora Isolated     AFB Smear (no units)   Date Value   09/29/2021     NO ACID FAST BACILLI SEEN- 3 negative smears do not exclude pulmonary TB. If active pulmonary TB is suspected, continue airborne isolation until pulmonary disease is excluded by negative cultures.   12/16/2020     NO ACID FAST BACILLI SEEN- 3 negative smears do not exclude pulmonary TB. If active pulmonary TB is suspected, continue airborne isolation until pulmonary disease is excluded by negative cultures.     AFB Culture (no units)   Date Value   09/29/2021 No Acid Fast Bacilli Detected   12/16/2020 No Acid Fast Bacilli Detected       Pertinent Imaging Data:  CT Chest (10/15/20): Completed at [facility]   Impression:   1. Areas of bronchiectasis, bronchial wall thickening and cystic  change with upper lobe predominance, pattern of disease that could   be seen in the setting of cystic fibrosis. Atypical infection could have a similar appearance.   2. Tree-in-bud opacities in the superior segment of the RIGHT lower lobe and scattered small pulmonary nodules about the periphery of   the RIGHT upper lobe. Correlate with any respiratory symptoms. No consolidation.   3. Incidental imaging of upper abdominal contents shows indistinct boundaries of the pancreas perhaps related to prior pancreatitis. No   overt edema in the upper abdomen. Low-dose nature of the scan also limits detail in these areas, correlate with any ongoing abdominal   symptoms.     MRCP (10/12/20): Completed at [facility]   Impression:   1. No specific pancreatic abnormality to determine etiology of pancreatitis. Subtle indistinct margin through the neck and head of   the pancreas. Pancreatic duct is not identified.   2. No secondary signs of autoimmune pancreatitis.   3. Normal common bile duct.  No cholelithiasis.   4. No evidence of acute pancreatic inflammation.     CT Abd/Pelvis (07/29/20): Completed at [facility]  Impression:   1. Changes of acute interstitial edematous pancreatitis. No localized fluid collections and no ductal dilatation.   2. Small amount of free fluid in the pelvis could be physiologic or reactive related to the pancreatitis.     Other Health Maintenance:   Vaccines:   Immunization History   Administered Date(s) Administered    COVID-19 VAC,BIVALENT(33YR UP),PFIZER 09/29/2021    COVID-19 VACC,MRNA,(PFIZER)(PF) 02/21/2020, 03/16/2020, 10/15/2020    DTaP 12/28/1992, 02/17/1993, 04/28/1993, 05/01/1994, 10/27/1996    HPV Quadrivalent (Gardasil) 06/02/2009, 09/17/2009, 06/10/2010    Hepatitis A Vaccine Pediatric / Adolescent 2 Dose IM 11/14/2006, 06/02/2009    Hepatitis B vaccine, pediatric/adolescent dosage, [birth year]1992, 12/28/1992, 07/28/1993    HiB-PRP-T 12/28/1992, 02/17/1993, 04/28/1993, 02/02/1994    Influenza Recomb PF (Quad) Injectable(Egg Free)18+ 09/02/2020    Influenza Virus Vaccine, unspecified formulation 09/05/2020    MMR 02/02/1994, 10/27/1996    Meningococcal Conjugate MCV4P 06/10/2009    PPD Test 04/14/2014    Poliovirus,inactivated (IPV) 12/28/1992, 03/17/1993, 07/28/1993, 05/01/1994, 10/27/1996    TD(TDVAX),ADSORBED,2LF(IM)(PF) 02/17/2005    TdaP 06/10/2010, 09/10/2020    Varicella 11/06/1994, 11/14/2006 Injectable(Egg Free)18+ 09/02/2020, 09/05/2021   ??? Influenza Virus Vaccine, unspecified formulation 09/05/2020   ??? MMR 02/02/1994, 10/27/1996   ??? Meningococcal ACWY, Unspecified Formulation 06/10/2009   ??? Meningococcal Conjugate MCV4P 06/10/2009   ??? PPD Test 04/14/2014   ??? Polio Virus Vaccine, Unspecified Formulation 12/28/1992, 02/17/1993, 07/28/1993, 05/01/1994, 10/27/1996   ??? Poliovirus,inactivated (IPV) 12/28/1992, 03/17/1993, 07/28/1993, 05/01/1994, 10/27/1996   ??? TD(TDVAX),ADSORBED,2LF(IM)(PF) 02/17/2005   ??? TdaP 06/10/2010, 09/10/2020   ??? Varicella 11/06/1994, 11/14/2006

## 2022-07-10 DIAGNOSIS — J4530 Mild persistent asthma, uncomplicated: Principal | ICD-10-CM

## 2022-07-12 NOTE — Unmapped (Signed)
[facility]  Adult Cystic Fibrosis Clinic      Attempted to check-in with Darlene Sutton at her clinic appointment last week but she had left before this SW could see her. Called Darlene Sutton to follow-up as she had disclosed some new on-set anxiety with her provider. 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 Validated her feelings and observed the large responsibility of being in charge of 80 teenagers. Explored potential anxiety triggers and her physical symptoms of anxiety and discussed whether she has experienced anxiety in other areas of her life. 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     Darlene Sutton was diagnosed with CF in late 2021 and started Trikafta soon after. Explored how her recent diagnosis might contribute to more anxiety and she wonders if Trikafta has caused increased anxiety. She does not remember experiencing anxiety prior to taking Trikafta. Discussed potential options for treatment with [facility] and at this time, she and this Clinical research associate decided she would monitor her anxiety over the next few months. She understands she can reach out to this SW before her next appointment if her anxiety worsens, but at this time, she doesn't feel it is interfering with her functioning. Encouraged her to notice situations that trigger anxiety and discussed the importance of coping strategies. Will plan to follow-up at her next clinic appointment or sooner if she requests.

## 2022-08-01 NOTE — Unmapped (Signed)
[facility]    Home Spirometry Monitoring      07/31/22        I have reviewed 1 home spirometry test(s) over prior calendar month.  Most recent test is normal.  Compared to prior home spirometry values, the most recent test is unchanged. Best in-clinic FEV1 in last year was 3.97 L (116%) on 03/30/22.         Quality of most recent test graded A.     Follow-up plan:   ??? Reviewed results during her most recent clinic visit.   ??? Continue monthly home spirometry monitoring for the management of cystic fibrosis.    Karl Ito, [place]  [facility]   [phone]

## 2022-08-17 NOTE — Unmapped (Signed)
[facility] Special[facility] Pharm[facility] Refill Coordination Note    Darlene Sutton, DOB: [birth year]  Phone: [phone] (home)       All above HIPAA information was verified with patient.         08/17/2022    12:23 PM   Specialty Rx Medication Refill Questionnaire   Which Medications would you like refilled and shipped? Trikafta 2 weeks   Please list all current allergies: Na   Have you missed any doses in the last 30 days? No   Have you had any changes to your medication(s) since your last refill? No   How many days remaining of each medication do you have at home? 2 weeks   Have you experienced any side effects in the last 30 days? No   Please enter the full address (street address, city, state, zip code) where you would like your medication(s) to be delivered to. [address] , [place] [address]   Please specify on which day you would like your medication(s) to arrive. Note: if you need your medication(s) within 3 days, please call the pharmacy to schedule your order at [phone]  08/21/2022   Has your insurance changed since your last refill? No   Would you like a pharmacist to call you to discuss your medication(s)? No   Do you require a signature for your package? (Note: if we are billing Medicare Part B or your order contains a controlled substance, we will require a signature) No         Completed refill call assessment today to schedule patient's medication shipment from the [facility] [phone]).  All relevant notes have been reviewed.       Confirmed patient received a Conservation officer, historic buildings and a Surveyor, mining with first shipment. The patient will receive a drug information handout for each medication shipped and additional FDA Medication Guides as required.         REFERRAL TO PHARMACIST     Referral to the pharmacist: Not needed      [facility]     Shipping address confirmed in Epic.     Delivery Scheduled: Yes, Expected medication delivery date: 08/22/22.     Medication will be delivered via UPS to the prescription address in Epic WAM.    Torion Hulgan[facility] [address] [facility] Specialty Technician

## 2022-08-21 MED FILL — TRIKAFTA 100-50-75 MG (D)/150 MG (N) TABLETS: 84 days supply | Qty: 252 | Fill #4

## 2022-09-14 NOTE — Unmapped (Signed)
[facility]    Home Spirometry Monitoring      09/14/22        I have reviewed 1 home spirometry test(s) over prior calendar month.  Most recent test is normal.  Compared to prior home spirometry values, the most recent test is unchanged. Best in-clinic FEV1 in last year was 3.97 L (116%) on 03/30/22.         Quality of most recent test graded A.     Follow-up plan:   Reached out to patient via MyChart.   Continue monthly home spirometry monitoring for the management of cystic fibrosis.    Karl Ito, [place]  [facility]   [phone]

## 2022-10-10 ENCOUNTER — Ambulatory Visit: Admit: 2022-10-10 | Discharge: 2022-10-10 | Payer: PRIVATE HEALTH INSURANCE | Attending: Medical | Primary: Medical

## 2022-10-10 ENCOUNTER — Ambulatory Visit: Admit: 2022-10-10 | Discharge: 2022-10-10 | Payer: PRIVATE HEALTH INSURANCE

## 2022-10-10 NOTE — Unmapped (Signed)
Adult Cystic Fibrosis Clinic Pharmacist Visit     Darlene Sutton is a 31 y.o. 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 Pertinent CF related problems include GI manifestations. Last clinic visit on 07/06/22 with Karl Ito PA-C. At that time no changes were made to her medications.     Today, patient is doing well but does endorse constipation that comes and goes. Currently taking Miralax once daily or once every other day. Will supplement with citrucel if needed. Experiencing some abdominal pain and bloating when constipated.       OUTPATIENT CF RELATED MEDICATION REVIEW     Medications  Adherence/  Comments Labs as of 10/10/22   Assessment   Airway Clearance albuterol nebs every 6 hours prn  albuterol HFA every 6 hours prn  HTS 3.5% daily   Using albuterol and hypertonic saline once a day every 3 days for the last couple months. Noticed no changes. Last FEV1:   07/06/2022 - 114%  10/10/22 - 110.5% Normal function (FEV1 >90%).Marland Kitchen PFTs today have declined.   Chronic Respiratory/  Sinus/  Allergy Flonase 1 spray twice daily   Xyzal 5mg  once daily  Taking as prescribed. - Flonase daily     Xyzal daily prn allergies     CFTR Modulator Trikafta: 2 tablets (ELE/TEZ/IVA) QAM and 1 tablet (IVA) QPM   Taking as prescribed with fatty food.  Latest LFTs  Lab Results   Component Value Date    AST 20 12/29/2021    ALT 16 12/29/2021    ALKPHOS 55 12/29/2021    BILITOT 0.6 12/29/2021    BILIDIR 0.20 06/30/2021    On since Jan 2022, current schedule of LFT monitoring: annual  Last LFTs checked:   AST WNL.   ALT WNL  Tbili WNL  Up to date   Inhaled Antibiotics N/A   N/A Sputum culture/AFB  CF Sputum Culture   Date Value Ref Range Status   07/06/2022 <1+ Staphylococcus aureus (A)  Final     Comment:     Susceptibility Testing By Consultation Only   07/06/2022 3+ Oropharyngeal Flora Isolated  Final   03/30/2022 <1+ Methicillin-Susceptible Staphylococcus aureus (A)  Final 03/30/2022 4+ Oropharyngeal Flora Isolated  Final   12/29/2021 3+ Oropharyngeal Flora Isolated  Final   12/29/2021 <1+ Methicillin-Susceptible Staphylococcus aureus (A)  Final     AFB Culture   Date Value Ref Range Status   07/06/2022 No Acid Fast Bacilli Detected  Final   09/29/2021 No Acid Fast Bacilli Detected  Final   12/16/2020 No Acid Fast Bacilli Detected  Final    Exacerbations: Not frequent.   No antibiotic history available via chart review   Chronic ABX/  suppressive therapy   N/A N/A     Pancreatic Enzyme N/A - pancreatic sufficient        N/A      Vitamins Cholecalciferol 2,000 units daily  Taking as prescribed  Last vitamin levels  Lab Results   Component Value Date    VITAMINA 36.2 12/29/2021    VITDTOTAL 23.1 12/29/2021    VITAME 6.5 12/29/2021    PT 12.2 12/29/2021    INR 1.08 12/29/2021    Vitamin access:  OTC  Last vitamin Levels:   Vitamin A WNL.   Vitamin D abnormal, low.   Vitamin E WNL.   PT/INR WNL.  Up to date      Other GI Citrucel  Miralax 17g daily     Miralax -  every day or every other day.    Citrucel as needed     Constipated and bloated sometines ebbs and flows   Constipation not controlled.   Discussed starting by consistently taking Miralax daily.   Endocrine No h/o CFRD  No h/o bone disease   N/A LAST OGTT:  No results found for: GLUF, GLUCOSE2HR    Last DEXA: Z-Scores  Image Area 06/30/21   Spine 0.8   Femur N/A   Femoral Neck -0.9   Total Hip  0    Pancreatic sufficient  Last DEXA done 06/30/21 - WNL. Repeat due 2027     Neuro/  Psych N/A   N/A         Patient reported:  Side Effects reported: No   Insurance changes: No       MEDICATION MANAGEMENT   Adherence: Moderate (Minor variances to doses prescribed, misses occasional doses during the week)  Access: No issues related to access   Pharmacies used for CF medications:   [facility]    FOLLOW UP LAB(S)   Up to date    PLAN     Take Miralax daily for constipation    I spent a total of 10 minutes face to face with the patient delivering clinical care and providing education/counseling. Medications reviewed in EPIC medication station and updated today by the clinical pharmacist practitioner.     Recommendations and medication-related problems were discussed directly with pulmonologist, Durenda Hurt Rupcich, PA-C.    Electronically signed:  Alben Spittle, PharmD, BCACP, CPP  Clinical Pharmacist Practitioner  [facility] Adult [facility]yst[facility] Fibrosis/Pulmonary Clinic  [phone]      I am located on-site and the patient is located on-site for this visit.

## 2022-10-10 NOTE — Unmapped (Signed)
[facility]  Adult Cystic Fibrosis Clinic      Met with Goldye in person at her clinic visit. She states she is busy with work and has some work stress, but feels her anxiety is much improved. She is planning on leading the same summer trip next summer and states when she thinks about it she doesn't fel anxious. 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 She understands she can reach out to this writer as needed should her anxiety return or worsen.

## 2022-10-11 NOTE — Unmapped (Signed)
Pulmonary Cystic Fibrosis Clinic Note    ASSESSMENT     Darlene Sutton is a 31 y.o. female with cystic fibrosis who presents for routine CF follow-up visit. She has been doing well recently and her FEV1 is stable at 111%. She has decreased her frequency of using Albuterol and Hypertonic Saline to 3 times per week which I am comfortable with, and she will increase if she develops increased symptoms. She continues on Trikafta daily.     Problem List Items Addressed This Visit          Respiratory    Cystic fibrosis (CMS-HCC) - Primary    Relevant Orders    CF Sputum/ CF Sinus Culture    AFB culture         PLAN     1) Deferred CF sputum culture until next visit   2) Continue Albuterol, then Hypertonic Saline 3% + Aerobika for airway clearance - ok with 3 times per week, increase as needed   3) Continue Trikafta - monitor LFT's yearly (last checked 12/2021)   4) Continue Albuterol PRN, Xyzal/Flonase for allergies and asthma; consider Flovent next Spring or Allergy referral if allergies worsen in the future   5) Continue use of home spirometer before all visits and as needed with worsened respiratory symptoms   6) For her constipation, continue Miralax and Citrucel PRN - she can titrate the dose as needed for goal of 1 BM per day   7) She is established with a local PCP for women's health   8) Gave Flu shot today; recommended COVID booster locally (not available in clinic today)   9) Plan for annual CF labs next visit (does not need OGTT)   10) Patient interested in Remote CF Care research study     The patient will return to clinic in 3 months, or sooner if clinically indicated.    I personally spent 38 minutes face-to-face and non-face-to-face in the care of this patient, which includes all pre, intra, and post visit time on the date of service.  All documented time was specific to the E/M visit and does not include any procedures that may have been performed.    Karl Ito, [place]  [facility] [phone]    SUBJECTIVE:        Darlene Sutton is a 31 y.o. year old female with cystic fibrosis who comes in today for a routine CF follow-up visit. She has been doing well recently without any recent illnesses. She denies increased cough, sputum production, wheezing or shortness of breath. She has not noticed any allergy symptoms this time of year and has not been taking Claritin. She recently reduced Albuterol and Hypertonic Saline use to 3 times per week and doesn't notice any change in her symptoms. 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     She denies any recent GI symptoms such as abdominal pain, nausea, vomiting, diarrhea or heartburn. She typically has a BM every day to every other day. She takes Miralax daily to every other day. She needs Citrucel about once a week and may increase the frequency.     Since her last visit, she hasn't had any more concerns about anxiety. She believes that she anxiety she had after her [place] [place] trip was situational and she has not had any recurrence. She feels like walking more has been helpful.     She continues to live in [place]. 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 They got a new curriculum so she has had to work sometimes outside of work hours. She is going on a cruise with friends in 2 weeks for her 31th birthday. She is also going on a cruise with family for Thanksgiving.     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 for a first time episode of acute pancreatitis in 07/2020 and her CF genetic panel returned with 2 CF mutations. She had a CT Chest that showed evidence of bronchiectasis. She established care with our clinic in 11/2020 and afterwards started Trikafta.     Airway Clearance Regimen: Waymon Budge       Exercise Habits: Walking/running     Typical bacteria: MSSA     Last CF sputum culture: 07/06/22   Last AFB culture: 07/06/22     Inhaled ABX: None     Last oral antibiotics: Z-pack (date unknown)   Last IV antibiotics: Never     The patient judges that exacerbations requiring any antibiotic intervention (oral, inhaled, IV) are occurring about <1 times per year.    Hemoptysis: no  ABPA:  no  Ptx:  no  O2 needs:      no  Port:  no  Sinus symptoms (congestion, rhinorrhea, pain) are not present and this is felt to be the same compared to last visit.     Panc Insuf: No (fecal elastase normal at 378 on 12/28/20)   PEG:  no  Supplements: no  DIOS:  no  CF Liver Dz:   no  Patient is having approximately 1 stool daily to every other day.  Stools are described as formed in appearance.     Last Colonoscopy: Due at age 81 per CFF guidelines)    Diabetes: no  Last OGTT: none; (Fasting n/a; 2 hour n/a)   HgbA1C: 12/29/21 (4.8%)    Osteopenia: no   Last DEXA: 09/29/21    Depression: no  Anxiety:  no - recent 1x episode of anxiety while on outward bound trip   Adherence to medications and airway clearance is rated as Excellent.      Medications:  Reviewed with patient and updated in EPIC.  Outpatient Encounter Medications as of 10/10/2022   Medication Sig Dispense Refill    albuterol 2.5 mg /3 mL (0.083 %) nebulizer solution Inhale 1 vial (2.5 mg total) by nebulization daily before hypertonic saline nebs. May also inhale 1 vial (2.5 mg total) every six (6) hours as needed for shortness of breath/wheezing. 270 mL 12    albuterol HFA 90 mcg/actuation inhaler Inhale 2 puffs daily prior to hypertonic saline nebs. May also inhale 2 puffs every six (6) hours as needed for shortness of breath/wheezing. 25.5 g 3    cholecalciferol, vitamin D3-50 mcg, 2,000 unit,, 50 mcg (2,000 unit) cap Take by mouth.      elexacaftor-tezacaftor-ivacaft (TRIKAFTA) 100-50-75 mg(d) /150 mg (n) tablet Take 2 Tablets (Elexacaftor 100mg /Tezacaftor 50mg /Ivacaftor 75mg ) by mouth in the morning and 1 tablet (ivacaftor 150mg ) in the evening with fatty food 252 tablet 3    fluticasone propionate (FLONASE) 50 mcg/actuation nasal spray 1 spray into each nostril two (2) times a day. 16 g 11    levocetirizine (XYZAL) 5 MG tablet Take 1 tablet (5 mg total) by mouth every evening.  90 tablet 3    methylcellulose (CITRUCEL, SUCROSE,) oral powder       polyethylene glycol (GLYCOLAX) 17 gram/dose powder Take 17 g by mouth daily.      sodium chloride 3.5 % Nebu Inhale 1 vial (4ml) by nebulization once daily. 240 mL 5    fluticasone propionate (FLOVENT HFA) 44 mcg/actuation inhaler INHALE 2 puffs BY MOUTH TWICE DAILY. USE with spacer (Patient not taking: Reported on 10/10/2022) 10.6 g 3    [DISCONTINUED] loratadine (CLARITIN) 10 mg tablet Take 1 tablet (10 mg total) by mouth daily as needed.       No facility-administered encounter medications on file as of 10/10/2022.       Allergies:  No Known Allergies    Past Medical History:  Past Medical History:   Diagnosis Date    Asthma     Cystic fibrosis (CMS-HCC) 2021    Diagnosed with CF (F508del/A455E) after bout of acute pancreatitis     Pancreatitis, acute        Past Surgical History:  No past surgical history on file.    Social History:  Social History     Socioeconomic History    Marital status: Single   Occupational History    Occupation: Runner, broadcasting/film/video / Therapist, sports: Runner, broadcasting/film/video   Tobacco Use    Smoking status: Never    Smokeless tobacco: Never       Family History:  Family History   Problem Relation Age of Onset    No Known Problems Mother     No Known Problems Father     No Known Problems Sister     No Known Problems Sister     No Known Problems Brother     Asthma Brother     Cystic fibrosis Paternal Uncle        Review of Systems:  A 12 point review of systems was negative except for pertinent items noted in the HPI.        OBJECTIVE:   Objective   Physical exam:   Vitals:    10/10/22 1457   BP: 99/71   Pulse: 73   Resp: 16   Temp: 36.3 ??C (97.3 ??F)   TempSrc: Temporal   SpO2: 98%   Weight: 69.4 kg (153 lb)   Height: 167.4 cm (5' 5.9)       Body mass index is 24.77 kg/m??.  Wt Readings from Last 3 Encounters:   10/10/22 69.4 kg (153 lb)   07/06/22 70.8 kg (156 lb)   03/30/22 72.1 kg (159 lb)       GEN: Cooperative female, sitting up on exam table, NAD  HEAD: Normocephalic, atraumatic  EYES: PERRLA, anicteric sclerae, conjuctiva clear  NOSE: Nares and mucosa normal with midline septum, no drainage or sinus tenderness.  OROPHARYNX: Pink and moist without erythema or exudate  NECK: Supple, trachea midline  LYMPH: No palpable lymphadenopathy   HEART/CV: RRR, S1, S2 nl, no MRG  LUNGS: CTA bilaterally, no crackles or wheezes, normal WOB on RA  ABD: NABS, soft, NT/ND, no rebound or guarding, no masses, no hepatomegaly noted   EXT: No cyanosis, clubbing, or edema  SKIN: No rashes or lesions noted  NEURO: No focal deficits noted  PSYCH: Awake, alert, and interactive. Mood and affect appropriate.     Pulmonary Function Testing Results:  10/10/22      Interpretation: Spirometry is normal and unchanged from last visit.     Performed on 12/06/20  at [facility].   Results per CareEverywhere note:   FVC-Pre L 4.01   FVC-Predicted Pre % 99   FVC-Post L 3.97   FVC-Predicted Post % 98   Pre FEV1/FVC % % 83   Post FEV1/FCV % % 85   FEV1-Pre L 3.32   FEV1-Predicted Pre % 97   FEV1-Post L 3.39   DLCO uncorrected ml/min/mmHg 25.04   DLCO [place]% % 104   DLCO corrected ml/min/mmHg 25.04   DLCO COR %Predicted % 104   DLVA Predicted % 108   TLC L 5.95   TLC % Predicted % 111   RV % Predicted % 140     I reviewed interval medical records.    CF Monitoring:     Pertinent Laboratory Data:    CBC  Lab Results   Component Value Date    WBC 3.9 12/29/2021    HGB 12.8 12/29/2021    HCT 37.1 12/29/2021    PLT 299 12/29/2021       ELECTROLYTES  Lab Results   Component Value Date    Sodium 137 12/29/2021     Lab Results   Component Value Date    Potassium 4.1 12/29/2021     Lab Results   Component Value Date    Chloride 104 12/29/2021     Lab Results   Component Value Date    CO2 23.8 12/29/2021     Lab Results   Component Value Date    BUN 11 12/29/2021     Lab Results   Component Value Date    Creatinine 0.71 12/29/2021     Lab Results   Component Value Date    Glucose 96 12/29/2021     Lab Results   Component Value Date    Calcium 10.0 12/29/2021     Lab Results   Component Value Date    Anion Gap 9 12/29/2021       GI  Lab Results   Component Value Date    AST 20 12/29/2021    AST 42 (H) 04/15/2021     Lab Results   Component Value Date    ALT 16 12/29/2021    ALT 46 (H) 04/15/2021     Lab Results   Component Value Date    Alkaline Phosphatase 55 12/29/2021    Alkaline Phosphatase 78 04/15/2021     Lab Results   Component Value Date    Total Protein 7.7 12/29/2021    Total Protein 7.2 04/15/2021     Lab Results   Component Value Date    Albumin 4.1 12/29/2021     Lab Results   Component Value Date    GGT <7 12/16/2020     Lab Results   Component Value Date    Total Bilirubin 0.6 12/29/2021    Total Bilirubin 0.3 04/15/2021       VITAMIN LEVELS / INR:    Lab Results   Component Value Date    Vitamin A 36.2 12/29/2021     No results found for: VITD2  No results found for: VITD3  Lab Results   Component Value Date    Vitamin D Total (25OH) 23.1 12/29/2021     Lab Results   Component Value Date    Vitamin E 6.5 12/29/2021       Lab Results   Component Value Date    PT 12.2 12/29/2021     Lab Results   Component Value Date    INR 1.08  12/29/2021       IGE  Lab Results   Component Value Date    IgE, Total 636 (H) 12/29/2021    IgE, Total 1,041 (H) 12/16/2020       SPUTUM CULTURES  CF Sputum Culture (no units)   Date Value   07/06/2022 <1+ Staphylococcus aureus (A)   07/06/2022 3+ Oropharyngeal Flora Isolated     AFB Smear (no units)   Date Value   07/06/2022     NO ACID FAST BACILLI SEEN- 3 negative smears do not exclude pulmonary TB. If active pulmonary TB is suspected, continue airborne isolation until pulmonary disease is excluded by negative cultures.   09/29/2021     NO ACID FAST BACILLI SEEN- 3 negative smears do not exclude pulmonary TB. If active pulmonary TB is suspected, continue airborne isolation until pulmonary disease is excluded by negative cultures.     AFB Culture (no units)   Date Value   07/06/2022 No Acid Fast Bacilli Detected   09/29/2021 No Acid Fast Bacilli Detected       Pertinent Imaging Data:  CT Chest (10/15/20): Completed at [facility].   Impression:   1. Areas of bronchiectasis, bronchial wall thickening and cystic change with upper lobe predominance, pattern of disease that could   be seen in the setting of cystic fibrosis. Atypical infection could have a similar appearance.   2. Tree-in-bud opacities in the superior segment of the RIGHT lower lobe and scattered small pulmonary nodules about the periphery of   the RIGHT upper lobe. Correlate with any respiratory symptoms. No consolidation.   3. Incidental imaging of upper abdominal contents shows indistinct boundaries of the pancreas perhaps related to prior pancreatitis. No   overt edema in the upper abdomen. Low-dose nature of the scan also limits detail in these areas, correlate with any ongoing abdominal   symptoms.     MRCP (10/12/20): Completed at [facility]   Impression:   1. No specific pancreatic abnormality to determine etiology of pancreatitis. Subtle indistinct margin through the neck and head of   the pancreas. Pancreatic duct is not identified.   2. No secondary signs of autoimmune pancreatitis.   3. Normal common bile duct.  No cholelithiasis.   4. No evidence of acute pancreatic inflammation.     CT Abd/Pelvis (07/29/20): Completed at [facility]  Impression:   1. Changes of acute interstitial edematous pancreatitis. No localized fluid collections and no ductal dilatation.   2. Small amount of free fluid in the pelvis could be physiologic or reactive related to the pancreatitis.     Other Health Maintenance:   Vaccines:   Immunization History   Administered Date(s) Administered    COVID-19 VAC,BIVALENT(59YR UP),PFIZER 09/29/2021    COVID-19 VACC,MRNA,(PFIZER)(PF) 02/21/2020, 03/16/2020, 10/15/2020    DTaP 12/28/1992, 02/17/1993, 04/28/1993, 05/01/1994, 10/27/1996    DTaP, Unspecified Formulation 12/28/1992, 02/17/1993, 04/28/1993, 05/01/1994, 10/27/1996    HPV Quadrivalent (Gardasil) 06/02/2009, 09/17/2009, 06/10/2010    HPV, Unspecified Formulation 06/10/2010    Hepatitis A Vaccine Pediatric / Adolescent 2 Dose IM 11/14/2006, 06/02/2009    Hepatitis B Vaccine, Unspecified Formulation June 28, 1992, 12/28/1992, 07/28/1993    Hepatitis B vaccine, pediatric/adolescent dosage, 05/30/1992, 12/28/1992, 07/28/1993    HiB, unspecified 12/28/1992, 02/17/1993, 04/28/1993, 02/02/1994    HiB-PRP-T 12/28/1992, 02/17/1993, 04/28/1993, 02/02/1994    INFLUENZA TIV (TRI) 19MO+ W/ PRESERV (IM) 10/05/2007, 09/17/2009    Influenza LAIV (Nasal-Tri) HISTORICAL 11/14/2006    Influenza Recomb PF (Quad) Injectable(Egg Free)18+ 09/02/2020, 09/05/2021    Influenza Vaccine  Quad (IIV4 PF) 26mo+ injectable 10/10/2022    Influenza Virus Vaccine, unspecified formulation 09/05/2020    MMR 02/02/1994, 10/27/1996    Meningococcal ACWY, Unspecified Formulation 06/10/2009    Meningococcal Conjugate MCV4P 06/10/2009    PPD Test 04/14/2014    Polio Virus Vaccine, Unspecified Formulation 12/28/1992, 02/17/1993, 07/28/1993, 05/01/1994, 10/27/1996    Poliovirus,inactivated (IPV) 12/28/1992, 03/17/1993, 07/28/1993, 05/01/1994, 10/27/1996    TD(TDVAX),ADSORBED,2LF(IM)(PF) 02/17/2005    TdaP 06/10/2010, 09/10/2020    Varicella 11/06/1994, 11/14/2006

## 2022-11-07 NOTE — Unmapped (Signed)
[place] [facility]    Home Spirometry Monitoring      11/07/22        I have reviewed 1 home spirometry test(s) over prior calendar month.  Most recent test is normal.  Compared to prior home spirometry values, the most recent test is unchanged. Best in-clinic FEV1 in last year was 3.97 L (116%) on 03/30/22.         Quality of most recent test graded A.     Follow-up plan:   Reviewed results during most recent clinic visit.   Continue monthly home spirometry monitoring for the management of cystic fibrosis.    Karl Ito, [place]  [facility]   [phone]

## 2022-11-08 NOTE — Unmapped (Signed)
[facility] Refill Coordination Note    Darlene Sutton, DOB: 08/14/1991  Phone: [phone] (home)       All above HIPAA information was verified with patient.         11/07/2022     4:22 PM   Specialty Rx Medication Refill Questionnaire   Which Medications would you like refilled and shipped? Trikafta , Albuterol , Hypertonic sodium chloride   Please list all current allergies: Na   Have you missed any doses in the last 30 days? No   Have you had any changes to your medication(s) since your last refill? No   How many days remaining of each medication do you have at home? 15 days   Have you experienced any side effects in the last 30 days? No   Please enter the full address (street address, city, state, zip code) where you would like your medication(s) to be delivered to. [address] , [place] [address]   Please specify on which day you would like your medication(s) to arrive. Note: if you need your medication(s) within 3 days, please call the pharmacy to schedule your order at [phone]  11/20/2022   Has your insurance changed since your last refill? No   Would you like a pharmacist to call you to discuss your medication(s)? No   Do you require a signature for your package? (Note: if we are billing Medicare Part B or your order contains a controlled substance, we will require a signature) No   Additional Comments: I will be out of town from Nov 17-Nov 26. Please deliver either before or after.         Completed refill call assessment today to schedule patient's medication shipment from the [facility] [phone]).  All relevant notes have been reviewed.       Confirmed patient received a Conservation officer, historic buildings and a Surveyor, mining with first shipment. The patient will receive a drug information handout for each medication shipped and additional FDA Medication Guides as required.         REFERRAL TO PHARMACIST     Referral to the pharmacist: Not needed      [facility]     Shipping address confirmed in Epic.     Delivery Scheduled: Yes, Expected medication delivery date: 11/21/22.     Medication will be delivered via UPS to the prescription address in Epic WAM.    Chae Oommen' [facility] Specialty Technician

## 2022-11-20 MED FILL — ALBUTEROL SULFATE HFA 90 MCG/ACTUATION AEROSOL INHALER: 25 days supply | Qty: 8.5 | Fill #3

## 2022-11-20 MED FILL — TRIKAFTA 100-50-75 MG (D)/150 MG (N) TABLETS: 84 days supply | Qty: 252 | Fill #5

## 2022-11-20 MED FILL — HYPER-SAL 3.5 % SOLUTION FOR NEBULIZATION: RESPIRATORY_TRACT | 60 days supply | Qty: 240 | Fill #2

## 2023-01-09 ENCOUNTER — Ambulatory Visit: Admit: 2023-01-09 | Discharge: 2023-01-10 | Payer: PRIVATE HEALTH INSURANCE | Attending: Medical | Primary: Medical

## 2023-01-09 ENCOUNTER — Ambulatory Visit: Admit: 2023-01-09 | Discharge: 2023-01-10 | Payer: PRIVATE HEALTH INSURANCE

## 2023-01-09 ENCOUNTER — Institutional Professional Consult (permissible substitution)
Admit: 2023-01-09 | Discharge: 2023-01-10 | Payer: PRIVATE HEALTH INSURANCE | Attending: Registered" | Primary: Registered"

## 2023-01-09 LAB — COMPREHENSIVE METABOLIC PANEL
ALBUMIN: 4.2 g/dL (ref 3.4–5.0)
ALKALINE PHOSPHATASE: 46 U/L (ref 46–116)
ALT (SGPT): 14 U/L (ref 10–49)
ANION GAP: 11 mmol/L (ref 5–14)
AST (SGOT): 24 U/L (ref <=34)
BILIRUBIN TOTAL: 0.5 mg/dL (ref 0.3–1.2)
BLOOD UREA NITROGEN: 9 mg/dL (ref 9–23)
BUN / CREAT RATIO: 14
CALCIUM: 9.4 mg/dL (ref 8.7–10.4)
CHLORIDE: 107 mmol/L (ref 98–107)
CO2: 24.5 mmol/L (ref 20.0–31.0)
CREATININE: 0.65 mg/dL
EGFR CKD-EPI (2021) FEMALE: >90 mL/min/{1.73_m2} (ref >=60)
GLUCOSE RANDOM: 85 mg/dL (ref 70–179)
POTASSIUM: 3.9 mmol/L (ref 3.4–4.8)
PROTEIN TOTAL: 7.4 g/dL (ref 5.7–8.2)
SODIUM: 142 mmol/L (ref 135–145)

## 2023-01-09 LAB — HEMOGLOBIN A1C
ESTIMATED AVERAGE GLUCOSE: 94 mg/dL
HEMOGLOBIN A1C: 4.9 % (ref 4.8–5.6)

## 2023-01-09 LAB — CBC W/ AUTO DIFF
BASOPHILS ABSOLUTE COUNT: 0 10*9/L (ref 0.0–0.1)
BASOPHILS RELATIVE PERCENT: 0.8 %
EOSINOPHILS ABSOLUTE COUNT: 0.1 10*9/L (ref 0.0–0.5)
EOSINOPHILS RELATIVE PERCENT: 1.9 %
HEMATOCRIT: 37.3 % (ref 34.0–44.0)
HEMOGLOBIN: 12.9 g/dL (ref 11.3–14.9)
LYMPHOCYTES ABSOLUTE COUNT: 1.6 10*9/L (ref 1.1–3.6)
LYMPHOCYTES RELATIVE PERCENT: 34.7 %
MEAN CORPUSCULAR HEMOGLOBIN CONC: 34.7 g/dL (ref 32.0–36.0)
MEAN CORPUSCULAR HEMOGLOBIN: 31.3 pg (ref 25.9–32.4)
MEAN CORPUSCULAR VOLUME: 90.4 fL (ref 77.6–95.7)
MEAN PLATELET VOLUME: 8.6 fL (ref 6.8–10.7)
MONOCYTES ABSOLUTE COUNT: 0.3 10*9/L (ref 0.3–0.8)
MONOCYTES RELATIVE PERCENT: 6.9 %
NEUTROPHILS ABSOLUTE COUNT: 2.6 10*9/L (ref 1.8–7.8)
NEUTROPHILS RELATIVE PERCENT: 55.7 %
NUCLEATED RED BLOOD CELLS: 0 /100{WBCs} (ref <=4)
PLATELET COUNT: 288 10*9/L (ref 150–450)
RED BLOOD CELL COUNT: 4.13 10*12/L (ref 3.95–5.13)
RED CELL DISTRIBUTION WIDTH: 13.2 % (ref 12.2–15.2)
WBC ADJUSTED: 4.7 10*9/L (ref 3.6–11.2)

## 2023-01-09 LAB — PROTIME-INR
INR: 0.98
PROTIME: 11 s (ref 9.9–12.6)

## 2023-01-09 NOTE — Unmapped (Signed)
Thank you for allowing Darlene Sutton to be a part of your care.     Goals and plans we discussed today:  Great to see you  Continue your home medications - increase frequency as needed  Follow up on 3/19 at 3PM over video    To reach your CF nurse coordinators:  Patients with the last name A-K: Joni Reining [phone]  Patients with the last name L-Z: Harriett Sine [phone]     For urgent issues after hours/weekends:  Hospital Operator: [phone]) [phone], ask for Pulmonary Fellow on-call     To make or change a clinic appointment:   [facility]: [phone]     MyChart Patient Guidelines:  To help you get the most out of your MyChart experience, the following guidelines have been developed. We thank you in advance for your cooperation.  MyChart messages are not read during nights, weekends, or holidays.   MyChart is to be utilized for non-urgent matters. It may take up to 3 business days for a response.  Please continue calling and leaving a message for your nurse for symptoms or urgent needs. Cystic Fibrosis Nurse Coordinators can be reached at the above numbers Monday-Friday 08:30 AM-4:30 PM  For urgent concerns after hours, weekends, and holidays: please page the on call pulmonary fellow by calling [phone]. If you do not receive a response within 30 minutes, please call back and have them paged again.  Cystic fibrosis clinics are on Tuesdays and Thursdays. There may be a delay in response during these days.  Please check with your pharmacy first for refills.  Form and letter requests may take up to 7-14 business days to complete depending on the nature of the request.     When you should call your nurse coordinator (NOT use MyChart) or call the pulmonary fellow on-call   Feeling sick or have a fever   Increase in cough   Change in amount of mucus or change mucus color/thickness  Coughing up blood or blood-tinged mucus  Chest pain  Shortness of breath   Lack of energy/Poor Appetite     Your experience with the CF team is important to Darlene Sutton. We hope we have provided you with excellent service today. As a part of our commitment to high quality care, we are always looking for ways to improve. Please contact our clinic or nurse coordinators for any concerns/compliments. Please be aware that you may receive a survey from [facility] regarding your visit today. We also invite you to participate in the [facility] Foundation's survey of our adult CF center with the below link. We encourage your participation and welcome your feedback.    Thank you for allowing Darlene Sutton to participate in your care!    [place] [facility]       [email or web address]    CF Experience of Care Survey  [email or web address]

## 2023-01-09 NOTE — Unmapped (Signed)
Adult Cystic Fibrosis Clinic  Yearly Assessment    Current Inhaled Medications:     Albuterol Neb 2.5mg  Daily  Albuterol HFA 40mcg/2puffs PRN  Hypertonic Saline 3% Daily    Have you smoked cigs in the last year? No Packs per day?    Have you used electronic cigs in the last year (vaping)? No How often?    Anyone in your household smoke cigs in the last year? No    How often in the past year have you been exposed to secondhand smoke?   Never       Home O2: None    Home Spirometer: The patient has a home spirometer and uses it regularly. Used it before todays clinic.     Review order meds are taken, frequency, compliance: The patient is normally compliant.     Barriers to Compliance and Solutions: n/a    Airway Clearance Used: The patient uses her [place] with her hypertonic saline. Gave a new [place] today.     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     Airway Clearance/Not effective: n/a    Spacer/MDI training: The patient has a spacer, uses it with the inhaled medications and is aware of the correct technique for use. Also reviewed cleaning of the spacer.  Gave a new spacer today.     Bipap/Cpap;Settings if use: n/a     Equipment Review/Cleaning: Boils to sterilize    DME Provider: n/a    Misc. Notes: Gave a new aerobika and spacer. The patient had no further needs or concerns today. Encouraged the patient to reach out should any arise.

## 2023-01-09 NOTE — Unmapped (Signed)
Pulmonary Cystic Fibrosis Clinic Note    ASSESSMENT     Darlene Sutton is a 32 y.o. female with cystic fibrosis who presents for routine CF follow-up visit. She has been doing well recently and her FEV1 is stable at 111%. She has decreased her frequency of using Albuterol and Hypertonic Saline to 3 times per week which I am comfortable with, and she will increase if she develops increased symptoms. She continues on Trikafta daily.     Problem List Items Addressed This Visit          Respiratory    Cystic fibrosis (CMS-HCC) - Primary    Relevant Orders    CF Sputum/ CF Sinus Culture    CBC w/ Differential    Comprehensive Metabolic Panel    Vitamin A    Vitamin D 25 Hydroxy (25OH D2 + D3)    Vitamin E    Hemoglobin A1c    IgE Total    PT-INR    PNEUMOCOCCAL CONJUGATE VACCINE 20-VALENT         PLAN     1) Deferred CF sputum culture until next visit   2) Continue Albuterol, then Hypertonic Saline 3% + Aerobika for airway clearance - ok with 3 times per week, increase as needed   3) Continue Trikafta - monitor LFT's yearly (last checked 12/2021)   4) Continue Albuterol PRN, Xyzal/Flonase for allergies and asthma; consider Flovent next Spring or Allergy referral if allergies worsen in the future   5) Continue use of home spirometer before all visits and as needed with worsened respiratory symptoms   6) For her constipation, continue Miralax and Citrucel PRN - she can titrate the dose as needed for goal of 1 BM per day   7) She is established with a local PCP for women's health   8) Gave Flu shot today; recommended COVID booster locally (not available in clinic today)   9) Plan for annual CF labs next visit (does not need OGTT)   10) Patient interested in Remote CF Care research study     The patient will return to clinic in 3 months, or sooner if clinically indicated.    I personally spent 38 minutes face-to-face and non-face-to-face in the care of this patient, which includes all pre, intra, and post visit time on the date of service.  All documented time was specific to the E/M visit and does not include any procedures that may have been performed.    Karl Ito, [place]  [facility]   [phone]    SUBJECTIVE:        Darlene Sutton is a 32 y.o. year old female with cystic fibrosis who comes in today for a routine CF follow-up visit. She has been doing well recently without any recent illnesses. She denies increased cough, sputum production, wheezing or shortness of breath. She has not noticed any allergy symptoms this time of year and has not been taking Claritin. She recently reduced Albuterol and Hypertonic Saline use to 3 times per week and doesn't notice any change in her symptoms. 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     She denies any recent GI symptoms such as abdominal pain, nausea, vomiting, diarrhea or heartburn. She typically has a BM every day to every other day. She takes Miralax daily to every other day. She needs Citrucel about once a week and may increase the frequency.     Since her last  visit, she hasn't had any more concerns about anxiety. She believes that she anxiety she had after her [employer] trip was situational and she has not had any recurrence. She feels like walking more has been helpful.     She continues to live in [place]. 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 They got a new curriculum so she has had to work sometimes outside of work hours. She is going on a cruise with friends in 2 weeks for her 32th birthday. She is also going on a cruise with family for Thanksgiving.     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 She had a CT Chest that showed evidence of bronchiectasis. She established care with our clinic in 11/2020 and afterwards started Trikafta.     Airway Clearance Regimen: Waymon Budge       Exercise Habits: Walking/running     Typical bacteria: MSSA     Last CF sputum culture: 07/06/22   Last AFB culture: 07/06/22     Inhaled ABX: None     Last oral antibiotics: Z-pack (date unknown)   Last IV antibiotics: Never     The patient judges that exacerbations requiring any antibiotic intervention (oral, inhaled, IV) are occurring about <1 times per year.    Hemoptysis: no  ABPA:  no  Ptx:  no  O2 needs:      no  Port:  no  Sinus symptoms (congestion, rhinorrhea, pain) are not present and this is felt to be the same compared to last visit.     Panc Insuf: No (fecal elastase normal at 378 on 12/28/20)   PEG:  no  Supplements: no  DIOS:  no  CF Liver Dz:   no  Patient is having approximately 1 stool daily to every other day.  Stools are described as formed in appearance.     Last Colonoscopy: Due at age 41 per CFF guidelines    Diabetes: no  Last OGTT: none; (Fasting n/a; 2 hour n/a)   HgbA1C: 01/09/23 (4.9%)    Osteopenia: no   Last DEXA: 09/29/21    Depression: no  Anxiety:  no - 1x episode of anxiety in Summer 2023 while on outward bound trip   Adherence to medications and airway clearance is rated as Excellent.      Medications:  Reviewed with patient and updated in EPIC.  Outpatient Encounter Medications as of 01/09/2023   Medication Sig Dispense Refill    albuterol 2.5 mg /3 mL (0.083 %) nebulizer solution Inhale 1 vial (2.5 mg total) by nebulization daily before hypertonic saline nebs. May also inhale 1 vial (2.5 mg total) every six (6) hours as needed for shortness of breath/wheezing. 270 mL 12    albuterol HFA 90 mcg/actuation inhaler Inhale 2 puffs daily prior to hypertonic saline nebs. May also inhale 2 puffs every six (6) hours as needed for shortness of breath/wheezing. 25.5 g 3    cholecalciferol, vitamin D3-50 mcg, 2,000 unit,, 50 mcg (2,000 unit) cap Take by mouth.      elexacaftor-tezacaftor-ivacaft (TRIKAFTA) 100-50-75 mg(d) /150 mg (n) tablet Take 2 Tablets (Elexacaftor 100mg /Tezacaftor 50mg /Ivacaftor 75mg ) by mouth in the morning and 1  tablet (ivacaftor 150mg ) in the evening with fatty food 252 tablet 3    fluticasone propionate (FLONASE) 50 mcg/actuation nasal spray 1 spray into each nostril two (2) times a day. 16 g 11    fluticasone propionate (FLOVENT HFA) 44 mcg/actuation inhaler INHALE 2 puffs BY MOUTH TWICE DAILY. USE with spacer 10.6 g 3    levocetirizine (XYZAL) 5 MG tablet Take 1 tablet (5 mg total) by mouth every evening. 90 tablet 3    methylcellulose (CITRUCEL, SUCROSE,) oral powder       polyethylene glycol (GLYCOLAX) 17 gram/dose powder Take 17 g by mouth daily.      sodium chloride 3.5 % Nebu Inhale 1 vial (4ml) by nebulization once daily. 240 mL 5     No facility-administered encounter medications on file as of 01/09/2023.       Allergies:  No Known Allergies    Past Medical History:  Past Medical History:   Diagnosis Date    Asthma     Cystic fibrosis (CMS-HCC) 2021    Diagnosed with CF (F508del/A455E) after bout of acute pancreatitis     Pancreatitis, acute        Past Surgical History:  No past surgical history on file.    Social History:  Social History     Socioeconomic History    Marital status: Single     Spouse name: None    Number of children: None    Years of education: None    Highest education level: None   Occupational History    Occupation: Runner, broadcasting/film/video / Therapist, sports: Advice worker [employer]   Tobacco Use    Smoking status: Never    Smokeless tobacco: Never       Family History:  Family History   Problem Relation Age of Onset    No Known Problems Mother     No Known Problems Father     No Known Problems Sister     No Known Problems Sister     No Known Problems Brother     Asthma Brother     Cystic fibrosis Paternal Uncle        Review of Systems:  A 12 point review of systems was negative except for pertinent items noted in the HPI.        OBJECTIVE:   Objective   Physical exam:   Vitals:    01/09/23 1353   BP: 102/77   BP Site: L Arm   BP Position: Sitting   BP Cuff Size: Medium   Pulse: 86   SpO2: 99%   Weight: 70.8 kg (156 lb)       Body mass index is 25.26 kg/m??.  Wt Readings from Last 3 Encounters:   01/09/23 70.8 kg (156 lb)   10/10/22 69.4 kg (153 lb)   07/06/22 70.8 kg (156 lb)       GEN: Cooperative female, sitting up on exam table, NAD  HEAD: Normocephalic, atraumatic  EYES: PERRLA, anicteric sclerae, conjuctiva clear  NOSE: Nares and mucosa normal with midline septum, no drainage or sinus tenderness.  OROPHARYNX: Pink and moist without erythema or exudate  NECK: Supple, trachea midline  LYMPH: No palpable lymphadenopathy   HEART/CV: RRR, S1, S2 nl, no MRG  LUNGS: CTA bilaterally, no crackles or wheezes, normal WOB on RA  ABD: NABS, soft, NT/ND, no rebound or guarding, no masses, no hepatomegaly noted   EXT: No cyanosis, clubbing, or edema  SKIN: No rashes or lesions  noted  NEURO: No focal deficits noted  PSYCH: Awake, alert, and interactive. Mood and affect appropriate.     Pulmonary Function Testing Results:  01/09/23      Interpretation: Spirometry is normal and unchanged from last visit.     Performed on 12/06/20 at [facility] Pulmonary.   Results per CareEverywhere note:   FVC-Pre L 4.01   FVC-Predicted Pre % 99   FVC-Post L 3.97   FVC-Predicted Post % 98   Pre FEV1/FVC % % 83   Post FEV1/FCV % % 85   FEV1-Pre L 3.32   FEV1-Predicted Pre % 97   FEV1-Post L 3.39   DLCO uncorrected ml/min/mmHg 25.04   DLCO Irene% % 104   DLCO corrected ml/min/mmHg 25.04   DLCO COR %Predicted % 104   DLVA Predicted % 108   TLC L 5.95   TLC % Predicted % 111   RV % Predicted % 140     I reviewed interval medical records.    CF Monitoring:     Pertinent Laboratory Data:    CBC  Lab Results   Component Value Date    WBC 3.9 12/29/2021    HGB 12.8 12/29/2021    HCT 37.1 12/29/2021    PLT 299 12/29/2021 HEART/CV: RRR, S1, S2 nl, no MRG  LUNGS: CTA bilaterally, no crackles or wheezes, normal WOB on RA  ABD: NABS, soft, NT/ND, no rebound or guarding, no masses, no hepatomegaly noted   EXT: No cyanosis, clubbing, or edema  SKIN: No rashes or lesions noted  NEURO: No focal deficits noted  PSYCH: Awake, alert, and interactive. Mood and affect appropriate.     Pulmonary Function Testing Results:  01/09/23      Interpretation: Spirometry is normal and unchanged from last visit.     Performed on 12/06/20 at [facility].   Results per CareEverywhere note:   FVC-Pre L 4.01   FVC-Predicted Pre % 99   FVC-Post L 3.97   FVC-Predicted Post % 98   Pre FEV1/FVC % % 83   Post FEV1/FCV % % 85   FEV1-Pre L 3.32   FEV1-Predicted Pre % 97   FEV1-Post L 3.39   DLCO uncorrected ml/min/mmHg 25.04   DLCO % % 104   DLCO corrected ml/min/mmHg 25.04   DLCO COR %Predicted % 104   DLVA Predicted % 108   TLC L 5.95   TLC % Predicted % 111   RV % Predicted % 140     I reviewed interval medical records.    CF Monitoring:     Pertinent Laboratory Data:    CBC  Lab Results   Component Value Date    WBC 4.7 01/09/2023    HGB 12.9 01/09/2023    HCT 37.3 01/09/2023    PLT 288 01/09/2023       ELECTROLYTES  Lab Results   Component Value Date    Sodium 142 01/09/2023     Lab Results   Component Value Date    Potassium 3.9 01/09/2023     Lab Results   Component Value Date    Chloride 107 01/09/2023     Lab Results   Component Value Date    CO2 24.5 01/09/2023     Lab Results   Component Value Date    BUN 9 01/09/2023     Lab Results   Component Value Date    Creatinine 0.65 01/09/2023     Lab Results   Component Value Date  Glucose 85 01/09/2023     Lab Results   Component Value Date    Calcium 9.4 01/09/2023     Lab Results   Component Value Date    Anion Gap 11 01/09/2023       GI  Lab Results   Component Value Date    AST 24 01/09/2023    AST 42 (H) 04/15/2021     Lab Results   Component Value Date    ALT 14 01/09/2023    ALT 46 Date Value   07/06/2022     NO ACID FAST BACILLI SEEN- 3 negative smears do not exclude pulmonary TB. If active pulmonary TB is suspected, continue airborne isolation until pulmonary disease is excluded by negative cultures.   09/29/2021     NO ACID FAST BACILLI SEEN- 3 negative smears do not exclude pulmonary TB. If active pulmonary TB is suspected, continue airborne isolation until pulmonary disease is excluded by negative cultures.     AFB Culture (no units)   Date Value   07/06/2022 No Acid Fast Bacilli Detected   09/29/2021 No Acid Fast Bacilli Detected       Pertinent Imaging Data:  CT Chest (10/15/20): Completed at [facility].   Impression:   1. Areas of bronchiectasis, bronchial wall thickening and cystic change with upper lobe predominance, pattern of disease that could   be seen in the setting of cystic fibrosis. Atypical infection could have a similar appearance.   2. Tree-in-bud opacities in the superior segment of the RIGHT lower lobe and scattered small pulmonary nodules about the periphery of   the RIGHT upper lobe. Correlate with any respiratory symptoms. No consolidation.   3. Incidental imaging of upper abdominal contents shows indistinct boundaries of the pancreas perhaps related to prior pancreatitis. No   overt edema in the upper abdomen. Low-dose nature of the scan also limits detail in these areas, correlate with any ongoing abdominal   symptoms.     MRCP (10/12/20): Completed at [facility]   Impression:   1. No specific pancreatic abnormality to determine etiology of pancreatitis. Subtle indistinct margin through the neck and head of   the pancreas. Pancreatic duct is not identified.   2. No secondary signs of autoimmune pancreatitis.   3. Normal common bile duct.  No cholelithiasis.   4. No evidence of acute pancreatic inflammation.     CT Abd/Pelvis (07/29/20): Completed at [facility]  Impression:   1. Changes of acute interstitial edematous pancreatitis. No localized fluid collections and no ductal dilatation.   2. Small amount of free fluid in the pelvis could be physiologic or reactive related to the pancreatitis.     Other Health Maintenance:   Vaccines:   Immunization History   Administered Date(s) Administered    COVID-19 VAC,BIVALENT(57YR UP),PFIZER 09/29/2021    COVID-19 VACC,MRNA,(PFIZER)(PF) 02/21/2020, 03/16/2020, 10/15/2020    Covid-19 Vac, (11yr+) (Spikevax) Monovalent Xbb.1.5 Moder  10/16/2022    DTaP 12/28/1992, 02/17/1993, 04/28/1993, 05/01/1994, 10/27/1996    DTaP, Unspecified Formulation 12/28/1992, 02/17/1993, 04/28/1993, 05/01/1994, 10/27/1996    HPV Quadrivalent (Gardasil) 06/02/2009, 09/17/2009, 06/10/2010    HPV, Unspecified Formulation 06/10/2010    Hepatitis A Vaccine Pediatric / Adolescent 2 Dose IM 11/14/2006, 06/02/2009    Hepatitis B Vaccine, Unspecified Formulation 1992/05/08, 12/28/1992, 07/28/1993    Hepatitis B vaccine, pediatric/adolescent dosage, March 20, 1992, 12/28/1992, 07/28/1993    HiB, unspecified 12/28/1992, 02/17/1993, 04/28/1993, 02/02/1994    HiB-PRP-T 12/28/1992, 02/17/1993, 04/28/1993, 02/02/1994    INFLUENZA TIV (TRI) 58MO+ W/ PRESERV (IM)  10/05/2007, 09/17/2009    Influenza LAIV (Nasal-Tri) HISTORICAL 11/14/2006    Influenza Vaccine PF(Quad)(Egg Free)18+(Flublok) 09/02/2020, 09/05/2021    Influenza Vaccine Quad(IM)6 MO-Adult(PF) 10/10/2022    Influenza Virus Vaccine, unspecified formulation 09/05/2020    MMR 02/02/1994, 10/27/1996    Meningococcal ACWY, Unspecified Formulation 06/10/2009    Meningococcal Conjugate MCV4P 06/10/2009    PPD Test 04/14/2014    Polio Virus Vaccine, Unspecified Formulation 12/28/1992, 02/17/1993, 07/28/1993, 05/01/1994, 10/27/1996    Poliovirus,inactivated (IPV) 12/28/1992, 03/17/1993, 07/28/1993, 05/01/1994, 10/27/1996    TD(TDVAX),ADSORBED,2LF(IM)(PF) 02/17/2005    TdaP 06/10/2010, 09/10/2020    Varicella 11/06/1994, 11/14/2006 11/14/2006, 06/02/2009    Hepatitis B Vaccine, Unspecified Formulation 08-05-1992, 12/28/1992, 07/28/1993    Hepatitis B vaccine, pediatric/adolescent dosage, 12/24/92, 12/28/1992, 07/28/1993    HiB, unspecified 12/28/1992, 02/17/1993, 04/28/1993, 02/02/1994    HiB-PRP-T 12/28/1992, 02/17/1993, 04/28/1993, 02/02/1994    INFLUENZA TIV (TRI) 83MO+ W/ PRESERV (IM) 10/05/2007, 09/17/2009    Influenza LAIV (Nasal-Tri) HISTORICAL 11/14/2006    Influenza Vaccine PF(Quad)(Egg Free)18+(Flublok) 09/02/2020, 09/05/2021    Influenza Vaccine Quad(IM)6 MO-Adult(PF) 10/10/2022    Influenza Virus Vaccine, unspecified formulation 09/05/2020    MMR 02/02/1994, 10/27/1996    Meningococcal ACWY, Unspecified Formulation 06/10/2009    Meningococcal Conjugate MCV4P 06/10/2009    PPD Test 04/14/2014    Polio Virus Vaccine, Unspecified Formulation 12/28/1992, 02/17/1993, 07/28/1993, 05/01/1994, 10/27/1996    Poliovirus,inactivated (IPV) 12/28/1992, 03/17/1993, 07/28/1993, 05/01/1994, 10/27/1996    TD(TDVAX),ADSORBED,2LF(IM)(PF) 02/17/2005    TdaP 06/10/2010, 09/10/2020    Varicella 11/06/1994, 11/14/2006

## 2023-01-09 NOTE — Unmapped (Signed)
[facility]  Adult Cystic Fibrosis Clinic        SW REFERRAL SOURCE/REASON: Darlene Sutton is a 32 y.o. female followed by [facility] for *** CF. Focus of conversation today is conducting assessment and addressing psychosocial needs.    CURRENT LIVING ARRANGEMENTS/MARITAL STATUS/NUMBER OF PERSONS IN HOME:                        SUPPORT SYSTEM:        SAFETY:          TRANSPORTATION:        EDUCATION:  {CFEducation ZOXWR:60454}    EMPLOYMENT/FINANCIAL STATUS: {CF Employment:96951}    HOBBIES/INTERESTS:       INSURANCE AND SUBSCRIBER (self/spouse/parents):     FOOD INSECURITY:     ADHERENCE:     MENTAL HEALTH/COPING: SW reviewed the mental health screening process and introduced the substance use screening process.       Darlene Sutton self administered the PHQ-9 for depression and GAD-7 for anxiety. She scored a 1 on PHQ-9 indicating no depression and she scored a 1 on the GAD-7 indicating no anxiety.    Darlene Sutton denies SI.      PHQ-9 Score: 1    Screening complete, no depression identified / no further action needed today        SUBSTANCE USE:  Darlene Sutton self-administered the AUDIT for alcohol use and the DAST-10 for drug abuse.  She scored a zero on the AUDIT, indicating no alcohol use.  She scored a zero on the DAST-10, indicating no drug use.      *** SA intervention ***.       ADVANCED DIRECTIVE:   {CF Advanced Directive:96949}    ADVANCED CARE PLANNING DISCUSSION: {CF Advanced Care Planning Discussion:96950}    PLAN:     Darlene Sutton, MSW USE:  Darlene Sutton self-administered the AUDIT for alcohol use and the DAST-10 for drug abuse.  She scored a zero on the AUDIT, indicating no alcohol use.  She scored a zero on the DAST-10, indicating no drug use.      ADVANCED DIRECTIVE:   Unknown/Not Discussed    ADVANCED CARE PLANNING DISCUSSION: Unknown/Not Discussed    PLAN:   Continue to provide on-going psycho-social support.     Darlene Sutton Darlene Sutton, MSW, LCSW

## 2023-01-10 MED ORDER — SODIUM CHLORIDE 3.5 % FOR NEBULIZATION
Freq: Every day | RESPIRATORY_TRACT | 5 refills | 60 days | Status: CP
Start: 2023-01-10 — End: ?
  Filled 2023-02-06: qty 240, 60d supply, fill #0

## 2023-01-10 MED ORDER — ALBUTEROL SULFATE 2.5 MG/3 ML (0.083 %) SOLUTION FOR NEBULIZATION
RESPIRATORY_TRACT | 12 refills | 0 days | Status: CP
Start: 2023-01-10 — End: ?

## 2023-01-10 MED ORDER — TRIKAFTA 100-50-75 MG (D)/150 MG (N) TABLETS
ORAL_TABLET | 3 refills | 0 days | Status: CP
Start: 2023-01-10 — End: ?
  Filled 2023-02-06: qty 84, 28d supply, fill #0

## 2023-01-10 NOTE — Unmapped (Signed)
Patient overdue for nutrition assessment. Per PA, no nutrition needs identified today. Will complete annual visit in ~3 months.    Jackqulyn Livings MPH, RD, LDN  Pager: [phone]

## 2023-01-13 LAB — IGE: TOTAL IGE: 522 [IU]/mL — ABNORMAL HIGH

## 2023-01-15 LAB — VITAMIN A: VITAMIN A RESULT: 34.5 ug/dL

## 2023-01-15 LAB — VITAMIN E: VITAMIN E LEVEL: 7.6 mg/L

## 2023-01-15 LAB — VITAMIN D 25 HYDROXY: VITAMIN D, TOTAL (25OH): 27.3 ng/mL (ref 20.0–80.0)

## 2023-01-19 NOTE — Unmapped (Signed)
Nutrition - Brief:    Lyana's fat-soluble vitamin levels were drawn at recent clinic visit; results documented below. Everything is WNL with the exception of Vitamin D, which remains below goal of 30. Dia reports inconsistent intake of Vit D3 2000 units daily. Plan to increase to Vit D3 4000 units daily via [facility]. Faxed order today with request for re-enrollment in the [employer]well vitamin and supplement grant. [place]on amenable to plan.    Fat-soluble vitamin levels   No results found for: CRP  Lab Results   Component Value Date    VITAMINA 34.5 01/09/2023    VITAMINA 36.2 12/29/2021    VITAMINA 36.4 12/16/2020     Lab Results   Component Value Date    VITDTOTAL 27.3 01/09/2023    VITDTOTAL 23.1 12/29/2021    VITDTOTAL 32.6 12/16/2020     Lab Results   Component Value Date    VITAME 7.6 01/09/2023    VITAME 6.5 12/29/2021    VITAME 7.3 12/16/2020     Lab Results   Component Value Date    PT 11.0 01/09/2023    PT 12.2 12/29/2021    PT 11.7 12/16/2020     No results found for: DESGCARBPT  No results found for: PIVKAII    Jackqulyn Livings MPH, RD, LDN  Pager: [phone]

## 2023-01-29 NOTE — Unmapped (Signed)
[facility]    Home Spirometry Monitoring      01/29/23        I have reviewed 1 home spirometry test(s) over prior calendar month.  Most recent test is normal.  Compared to prior home spirometry values, the most recent test is unchanged. Best in-clinic FEV1 in last year was 3.96 L (122%) on 01/09/23.         Quality of most recent test graded A.     Follow-up plan:   Reviewed results during her most recent clinic visit.   Continue monthly home spirometry monitoring for the management of cystic fibrosis.    Karl Ito, [place]  [facility]yst[facility]rosis [facility]   [phone]

## 2023-01-31 NOTE — Unmapped (Signed)
[facility] Refill Coordination Note    Specialty Medication(s) to be Shipped:   CF/Pulmonary/Asthma: -Trikafta 100-50-75    Other medication(s) to be shipped:  albuterol inh,hypersal      Darlene Sutton, DOB: Nov 17, 1991  Phone: [phone] (home)       All above HIPAA information was verified with patient.     Was a Nurse, learning disability used for this call? No    Completed refill call assessment today to schedule patient's medication shipment from the [facility] [phone]).  All relevant notes have been reviewed.     Specialty medication(s) and dose(s) confirmed: Regimen is correct and unchanged.   Changes to medications: Darlene Sutton reports no changes at this time.  Changes to insurance: No  New side effects reported not previously addressed with a pharmacist or physician: None reported  Questions for the pharmacist: No    Confirmed patient received a Conservation officer, historic buildings and a Surveyor, mining with first shipment. The patient will receive a drug information handout for each medication shipped and additional FDA Medication Guides as required.       DISEASE/MEDICATION-SPECIFIC INFORMATION        For CF patients: CF [employer] Grant Active? Yes    SPECIALTY MEDICATION ADHERENCE     Medication Adherence    Patient reported X missed doses in the last month: 0  Specialty Medication: TRIKAFTA 100-50-75 mg(d) /150 mg (n) tablet (elexacaftor-tezacaftor-ivacaft)  Patient is on additional specialty medications: No  Patient is on more than two specialty medications: No  Any gaps in refill history greater than 2 weeks in the last 3 months: no  Demonstrates understanding of importance of adherence: yes  Informant: patient  Reliability of informant: reliable  Provider-estimated medication adherence level: good  Patient is at risk for Non-Adherence: No  Reasons for non-adherence: no problems identified                  Confirmed plan for next specialty medication refill: delivery by pharmacy  Refills needed for supportive medications: not needed          Refill Coordination    Has the Patients' Contact Information Changed: No  Is the Shipping Address Different: No         Were doses missed due to medication being on hold? No    trikafta 100-50-75 mg: 14 days of medicine on hand       REFERRAL TO PHARMACIST     Referral to the pharmacist: Not needed      [facility]     Shipping address confirmed in Epic.     Delivery Scheduled: Yes, Expected medication delivery date: 02/14.     Medication will be delivered via UPS to the prescription address in Epic WAM.    Antonietta Barcelona   [facility] Specialty Technician

## 2023-02-06 MED FILL — ALBUTEROL SULFATE HFA 90 MCG/ACTUATION AEROSOL INHALER: 25 days supply | Qty: 8.5 | Fill #4

## 2023-02-27 MED ORDER — ALBUTEROL SULFATE HFA 90 MCG/ACTUATION AEROSOL INHALER
3 refills | 0 days | Status: CP
Start: 2023-02-27 — End: ?
  Filled 2023-03-07: qty 25.5, 75d supply, fill #0

## 2023-03-05 NOTE — Unmapped (Signed)
[facility] Clinical Assessment & Refill Coordination Note    Darlene Sutton, DOB: 30-Nov-1991  Phone: [phone] (home)     All above HIPAA information was verified with patient.     Was a Nurse, learning disability used for this call? No    Specialty Medication(s):   CF/Pulmonary/Asthma: -Trikafta 100-50-75mg      Current Outpatient Medications   Medication Sig Dispense Refill    albuterol 2.5 mg /3 mL (0.083 %) nebulizer solution Inhale 1 vial (2.5 mg total) by nebulization daily before hypertonic saline nebs. May also inhale 1 vial (2.5 mg total) every six (6) hours as needed for shortness of breath/wheezing. 270 mL 12    albuterol HFA 90 mcg/actuation inhaler Inhale 2 puffs daily prior to hypertonic saline nebs. May also inhale 2 puffs every six (6) hours as needed for shortness of breath/wheezing. 25.5 g 3    cholecalciferol, vitamin D3-50 mcg, 2,000 unit,, 50 mcg (2,000 unit) cap Take by mouth.      elexacaftor-tezacaftor-ivacaft (TRIKAFTA) 100-50-75 mg(d) /150 mg (n) tablet Take 2 Tablets (Elexacaftor 100mg /Tezacaftor 50mg /Ivacaftor 75mg ) by mouth in the morning and 1 tablet (ivacaftor 150mg ) in the evening with fatty food 252 tablet 3    fluticasone propionate (FLONASE) 50 mcg/actuation nasal spray 1 spray into each nostril two (2) times a day. 16 g 11    fluticasone propionate (FLOVENT HFA) 44 mcg/actuation inhaler INHALE 2 puffs BY MOUTH TWICE DAILY. USE with spacer 10.6 g 3    levocetirizine (XYZAL) 5 MG tablet Take 1 tablet (5 mg total) by mouth every evening. 90 tablet 3    methylcellulose (CITRUCEL, SUCROSE,) oral powder       polyethylene glycol (GLYCOLAX) 17 gram/dose powder Take 17 g by mouth daily.      sodium chloride 3.5 % Nebu Inhale 1 vial (4ml) by nebulization once daily. 240 mL 5     No current facility-administered medications for this visit.        Changes to medications: Keiasha reports no changes at this time.    No Known Allergies    Changes to allergies: No    SPECIALTY MEDICATION ADHERENCE     Trikafta 100-50-75 mg: 9 days of medicine on hand     Medication Adherence    Patient reported X missed doses in the last month: 1  Specialty Medication: Trikafta 100-50-75mg   Patient is on additional specialty medications: No  Patient is on more than two specialty medications: No  Any gaps in refill history greater than 2 weeks in the last 3 months: no  Demonstrates understanding of importance of adherence: yes  Informant: patient          Specialty medication(s) dose(s) confirmed: Regimen is correct and unchanged.     Are there any concerns with adherence? No    Adherence counseling provided? Not needed    CLINICAL MANAGEMENT AND INTERVENTION      Clinical Benefit Assessment:    Do you feel the medicine is effective or helping your condition? Yes    Clinical Benefit counseling provided? Progress note from 1/6 shows evidence of clinical benefit    Adverse Effects Assessment:    Are you experiencing any side effects? No    Are you experiencing difficulty administering your medicine? No    Quality of Life Assessment:    Quality of Life    Rheumatology  Oncology  Dermatology  Cystic Fibrosis  1. What impact has your specialty medication had on your ability to engage in physical  activity?: Some  2. What impact has our specialty pharmacy medication management service had on the burden of your treatment regimen in your daily life?: Tremendous          How many days over the past month did your cystic fibrosis  keep you from your normal activities? For example, brushing your teeth or getting up in the morning. 0    Have you discussed this with your provider? Not needed    Acute Infection Status:    Acute infections noted within Epic:  CF Patient  Patient reported infection: None    Therapy Appropriateness:    Is therapy appropriate and patient progressing towards therapeutic goals? Yes, therapy is appropriate and should be continued    DISEASE/MEDICATION-SPECIFIC INFORMATION      For CF patients: CF [facility] Active? 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 N/A - pancreatic sufficient  Is the patient receiving adequate infection prevention treatment? Not applicable  Does the patient have adequate nutritional support? Yes, taking cholecalciferol 2000 units. Nutrition monitored by CF team     PATIENT SPECIFIC NEEDS     Does the patient have any physical, cognitive, or cultural barriers? No    Is the patient high risk? No    Did the patient require a clinical intervention? No    Does the patient require physician intervention or other additional services (i.e., nutrition, smoking cessation, social work)? No    SOCIAL DETERMINANTS OF HEALTH     At the [facility], we have learned that life circumstances - like trouble affording food, housing, utilities, or transportation can affect the health of many of our patients.   That is why we wanted to ask: are you currently experiencing any life circumstances that are negatively impacting your health and/or quality of life? Patient declined to answer    Social Determinants of Health     Financial Resource Strain: Low Risk  (01/09/2023)    Overall Financial Resource Strain (CARDIA)     Difficulty of Paying Living Expenses: Not hard at all   Internet Connectivity: Not on file   Food Insecurity: No Food Insecurity (01/09/2023)    Hunger Vital Sign     Worried About Running Out of Food in the Last Year: Never true     Ran Out of Food in the Last Year: Never true   Tobacco Use: Low Risk  (01/10/2023)    Patient History     Smoking Tobacco Use: Never     Smokeless Tobacco Use: Never     Passive Exposure: Not on file   Housing/Utilities: Low Risk  (01/09/2023)    Housing/Utilities     Within the past 12 months, have you ever stayed: outside, in a car, in a tent, in an overnight shelter, or temporarily in someone else's home (i.e. couch-surfing)?: No     Are you worried about losing your housing?: No     Within the past 12 months, have you been unable to get utilities (heat, electricity) when it was really needed?: No   Alcohol Use: Not At Risk (01/09/2023)    Alcohol Use     How often do you have a drink containing alcohol?: Never     How many drinks containing alcohol do you have on a typical day when you are drinking?: 1 - 2     How often do you have 5 or more drinks on one occasion?: Never  Transportation Needs: No Transportation Needs (01/09/2023)    PRAPARE - Therapist, art (Medical): No     Lack of Transportation (Non-Medical): No   Substance Use: Low Risk  (01/09/2023)    Substance Use     Taken prescription drugs for non-medical reasons: Never     Taken illegal drugs: Never     Patient indicated they have taken drugs in the past year for non-medical reasons: Yes, [positive answer(s)]: Not on file   Health Literacy: Not on file   Physical Activity: Not on file   Interpersonal Safety: Not on file   Stress: Not on file   Intimate Partner Violence: Not on file   Depression: Not at risk (01/09/2023)    PHQ-2     PHQ-2 Score: 0   Social Connections: Not on file       Would you be willing to receive help with any of the needs that you have identified today? Not applicable       SHIPPING     Specialty Medication(s) to be Shipped:   CF/Pulmonary/Asthma: -Trikafta 100-50-75mg     Other medication(s) to be shipped:  albuterol inhaler     Changes to insurance: No    Patient was informed of new phone menu: No    Delivery Scheduled: Yes, Expected medication delivery date: 03/08/23.     Medication will be delivered via UPS to the confirmed prescription address in [facility].    The patient will receive a drug information handout for each medication shipped and additional FDA Medication Guides as required.  Verified that patient has previously received a Conservation officer, historic buildings and a Surveyor, mining.    The patient or caregiver noted above participated in the development of this care plan and knows that they can request review of or adjustments to the care plan at any time.      All of the patient's questions and concerns have been addressed.    [name], PharmD   [facility] Specialty Pharmacist

## 2023-03-07 MED FILL — TRIKAFTA 100-50-75 MG (D)/150 MG (N) TABLETS: 28 days supply | Qty: 84 | Fill #1

## 2023-03-13 ENCOUNTER — Telehealth
Admit: 2023-03-13 | Discharge: 2023-03-13 | Payer: PRIVATE HEALTH INSURANCE | Attending: Registered" | Primary: Registered"

## 2023-03-13 ENCOUNTER — Telehealth: Admit: 2023-03-13 | Discharge: 2023-03-13 | Payer: PRIVATE HEALTH INSURANCE | Attending: Medical | Primary: Medical

## 2023-03-13 DIAGNOSIS — J302 Other seasonal allergic rhinitis: Principal | ICD-10-CM

## 2023-03-13 DIAGNOSIS — J4540 Moderate persistent asthma, uncomplicated: Principal | ICD-10-CM

## 2023-03-13 MED ORDER — AMOXICILLIN 875 MG-POTASSIUM CLAVULANATE 125 MG TABLET
ORAL_TABLET | Freq: Two times a day (BID) | ORAL | 0 refills | 14 days | Status: CP
Start: 2023-03-13 — End: 2023-03-27

## 2023-03-13 MED ORDER — FLUTICASONE PROPIONATE 115 MCG-SALMETEROL 21 MCG/ACTUATION HFA INHALER
Freq: Two times a day (BID) | RESPIRATORY_TRACT | 2 refills | 30 days | Status: CP
Start: 2023-03-13 — End: 2024-03-12

## 2023-03-13 MED ORDER — AZELASTINE 137 MCG (0.1 %) NASAL SPRAY AEROSOL
Freq: Two times a day (BID) | NASAL | 2 refills | 30 days | Status: CP
Start: 2023-03-13 — End: 2024-03-12

## 2023-03-13 NOTE — Unmapped (Signed)
Thank you for allowing [place] to be a part of your care.     Goals and plans we discussed today:  Nice to see you over video today  Use Albuterol, Hypertonic Saline 3% + Aerobika once daily for airway clearance right now  Continue Albuterol as needed and add Advair 2 puffs twice daily with a spacer after airway clearance  Start Neilmed sinus rinses once daily - make sure to use distilled water or tap water that's been boiled and cooled down first   Continue Flonase, add Astelin nasal spray  Take Augmentin with you on your trip - don't take unless your cough/mucus doesn't improve   I sent a referral to [facility]   Follow up with me in about 4 months     To reach your CF nurse coordinators:  Patients with the last name A-K: Joni Reining [phone]  Patients with the last name L-Z: Harriett Sine [phone]     For urgent issues after hours/weekends:  Hospital Operator: [phone]) [phone], ask for Pulmonary Fellow on-call     To make or change a clinic appointment:   [facility]: [phone]     MyChart Patient Guidelines:  To help you get the most out of your MyChart experience, the following guidelines have been developed. We thank you in advance for your cooperation.  MyChart messages are not read during nights, weekends, or holidays.   MyChart is to be utilized for non-urgent matters. It may take up to 3 business days for a response.  Please continue calling and leaving a message for your nurse for symptoms or urgent needs. Cystic Fibrosis Nurse Coordinators can be reached at the above numbers Monday-Friday 08:30 AM-4:30 PM  For urgent concerns after hours, weekends, and holidays: please page the on call pulmonary fellow by calling [phone]. If you do not receive a response within 30 minutes, please call back and have them paged again.  Cystic fibrosis clinics are on Tuesdays and Thursdays. There may be a delay in response during these days.  Please check with your pharmacy first for refills.  Form and letter requests may take up to 7-14 business days to complete depending on the nature of the request.     When you should call your nurse coordinator (NOT use MyChart) or call the pulmonary fellow on-call   Feeling sick or have a fever   Increase in cough   Change in amount of mucus or change mucus color/thickness  Coughing up blood or blood-tinged mucus  Chest pain  Shortness of breath   Lack of energy/Poor Appetite     Your experience with the CF team is important to [place]. We hope we have provided you with excellent service today. As a part of our commitment to high quality care, we are always looking for ways to improve. Please contact our clinic or nurse coordinators for any concerns/compliments. Please be aware that you may receive a survey from [facility] regarding your visit today. We also invite you to participate in the [facility] Foundation's survey of our adult CF center with the below link. We encourage your participation and welcome your feedback.    Thank you for allowing [place] to participate in your care!    [facility]       [email or web address]    CF Experience of Care Survey  [email or web address]

## 2023-03-14 NOTE — Unmapped (Signed)
[facility] Pulmonary Diseases and Critical Care Medicine  Video Telehealth Visit    Adult Cystic Fibrosis Clinic    03/13/23    Assessment:      Patient:Darlene Sutton (1991-11-20)    Darlene Sutton is a 32 y.o. female who is seen in a scheduled video telehealth visit for cystic fibrosis. Her seasonal allergy symptoms and asthma have flared over the past 1 week. She restarted Xyzal and continues on Flonase and Albuterol. Her cough and sputum production are increased, and her home FEV1 is decreased to 99% from her last home FEV1 of 108%. She is using Hypertonic Saline 3% + Aerobika every other day for airway clearance. We will add sinus rinses and Astelin nasal spray for her nasal symptoms and Advair for asthma. Advised her to increase airway clearance to daily. We will check back in after 2-3 days and if her cough/sputum are not improved, we will consider a course of Augmentin.      Plan:      Problem List Items Addressed This Visit          Respiratory    Cystic fibrosis (CMS-HCC) - Primary    Relevant Medications    fluticasone propion-salmeterol (ADVAIR HFA) 115-21 mcg/actuation inhaler    amoxicillin-clavulanate (AUGMENTIN) 875-125 mg per tablet    Other Relevant Orders    Ambulatory referral to Allergy    Asthma    Relevant Medications    fluticasone propion-salmeterol (ADVAIR HFA) 115-21 mcg/actuation inhaler    Other Relevant Orders    Ambulatory referral to Allergy     Other Visit Diagnoses       Seasonal allergies        Relevant Medications    azelastine (ASTELIN) 137 mcg (0.1 %) nasal spray    Other Relevant Orders    Ambulatory referral to Allergy          1) Reviewed home spirometry results and FEV1 is decreased from usual baseline  2) Continue Xyzal/Flonase for seasonal allergic rhinitis - add sinus rinses and Astelin nasal spray daily; will refer to [facility] due to significant symptoms annually during this time of year   3) Continue Albuterol BID and PRN asthma symptoms; add Advair 115-21 mcg 2 puffs twice daily with spacer, rinse mouth after use  4) Increase Hypertonic Saline 3% + Aerobika from every other day to daily   5) I will check back in with her by phone in 2-3 days, asked her to repeat home spirometry, if cough/sputum are not improving, will consider course of Augmentin   6) Continue Trikafta - monitor LFTs yearly, last checked in 12/2022   7) Continue Miralax every other day for chronic constipation  8) She is established with a local PCP for women's health   9) UTD on flu vaccine, COVID booster, and pneumococcal vaccine   10) Patient is enrolled in the Remote CF Care research study; alerted the research coordinator that she is unable to log into the app     Return in about 4 months (around 07/19/2023).    The above plan was discussed with the patient and she is in agreement.      Karl Ito, [place]  [facility]   [phone]     Subjective:        Patient's primary [place] pulmonologist: Karl Ito, PA-C   Patient's physical location during visit [place], State): [place], [place]   Patient's call back number: [phone]  Name/Relationship of other individuals with the patient  also on the call during video telehealth visit: None   I have confirmed that the patient requests and consents to having the above listed persons stay during the video telehealth visit.      HPI: Darlene Sutton is a 32 y.o. female who is seen in a scheduled video telehealth visit for cystic fibrosis. She reports having worsened allergy symptoms for the past 1 week. This includes nasal congestion and dripping. Over the weekend, she had sinus pressure and ear popping but that has improved. This week, she has been coughing more and her sputum looks clear-yellow. She took a COVID test to be safe and it was negative. She restarted Xyzal about 2 weeks ago. She is using Flonase 1 spray twice daily in each nostril. She typically uses the Albuterol MDI but switched to the nebulizer solution 3 nights ago. She also uses Albuterol MDI 2 puffs every morning before work. She has been using Hypertonic Saline every other night. She has an old Flovent inhaler at home but hasn't used it. She has been walking outside for exercise. She denies any new GI symptoms such as abdominal pain, nausea, vomiting, diarrhea, or heartburn. Her constipation is controlled on Miralax every other day. She has a BM every day to every other day. She is no longer taking a fiber supplement. She is leaving for a Spring Break trip to [place] this Friday. 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     Key CF History:    Genotype: F508del / A455E  CFTR modulator: Trikafta - started late January 2022   Last LFT check: 01/09/23  CFRD (y/n): No   CF liver disease: No   NTM colonization/disease: No   Other complications: Asthma, seasonal allergies     Airway Clearance Modality: Aerobika     Compliance with meds and ACT: Good     Past Medical History:   Diagnosis Date    Asthma     Cystic fibrosis (CMS-HCC) 2021    Diagnosed with CF (F508del/A455E) after bout of acute pancreatitis     Pancreatitis, acute        No past surgical history on file.    Family History   Problem Relation Age of Onset    No Known Problems Mother     No Known Problems Father     No Known Problems Sister     No Known Problems Sister     No Known Problems Brother     Asthma Brother     Cystic fibrosis Paternal Uncle        Social History     Tobacco Use    Smoking status: Never    Smokeless tobacco: Never       Allergies as of 03/13/2023    (No Known Allergies)       Current Outpatient Medications   Medication Sig Dispense Refill    albuterol 2.5 mg /3 mL (0.083 %) nebulizer solution Inhale 1 vial (2.5 mg total) by nebulization daily before hypertonic saline nebs. May also inhale 1 vial (2.5 mg total) every six (6) hours as needed for shortness of breath/wheezing. 270 mL 12    albuterol HFA 90 mcg/actuation inhaler Inhale 2 puffs daily prior to hypertonic saline nebs. May also inhale 2 puffs every six (6) hours as needed for shortness of breath/wheezing. 25.5 g 3    amoxicillin-clavulanate (AUGMENTIN) 875-125 mg per tablet Take 1 tablet by mouth two (2) times  a day for 14 days. 28 tablet 0    azelastine (ASTELIN) 137 mcg (0.1 %) nasal spray 1 spray into each nostril two (2) times a day. Use in each nostril as directed 1.2 mL 2    cholecalciferol, vitamin D3-50 mcg, 2,000 unit,, 50 mcg (2,000 unit) cap Take by mouth.      elexacaftor-tezacaftor-ivacaft (TRIKAFTA) 100-50-75 mg(d) /150 mg (n) tablet Take 2 Tablets (Elexacaftor 100mg /Tezacaftor 50mg /Ivacaftor 75mg ) by mouth in the morning and 1 tablet (ivacaftor 150mg ) in the evening with fatty food 252 tablet 3    fluticasone propion-salmeterol (ADVAIR HFA) 115-21 mcg/actuation inhaler Inhale 2 puffs two (2) times a day. Use with spacer and rinse mouth after use. 12 g 2    fluticasone propionate (FLONASE) 50 mcg/actuation nasal spray 1 spray into each nostril two (2) times a day. 16 g 11    levocetirizine (XYZAL) 5 MG tablet Take 1 tablet (5 mg total) by mouth every evening. 90 tablet 3    methylcellulose (CITRUCEL, SUCROSE,) oral powder       polyethylene glycol (GLYCOLAX) 17 gram/dose powder Take 17 g by mouth daily.      sodium chloride 3.5 % Nebu Inhale 1 vial (4ml) by nebulization once daily. 240 mL 5     No current facility-administered medications for this visit.         Physical Exam (as applicable to extent possible): Cooperative female, sitting up, NAD, runny nose and sniffling, conversant, normal WOB on RA      Diagnostic Review:   The following data were reviewed during this video telehealth visit with key findings summarized below:    Pulmonary Function Testing   Home FEV1 03/13/23: 3.39 L (99%) - worsened allergies and cough         Clinic History:       Laboratory Data  Lab Results   Component Value Date/Time    WBC 4.7 01/09/2023 03:35 PM    WBC 3.5 04/15/2021 10:55 AM    HGB 12.9 01/09/2023 03:35 PM    HGB 13.8 04/15/2021 10:55 AM    HCT 37.3 01/09/2023 03:35 PM    HCT 39.6 04/15/2021 10:55 AM    PLT 288 01/09/2023 03:35 PM    PLT 276 04/15/2021 10:55 AM     No results found for: CRP  Lab Results   Component Value Date/Time    CREATININE 0.65 01/09/2023 03:35 PM    GLU 85 01/09/2023 03:35 PM     Lab Results   Component Value Date/Time    BILITOT 0.5 01/09/2023 03:35 PM    BILITOT 0.3 04/15/2021 10:55 AM    ALKPHOS 46 01/09/2023 03:35 PM    ALKPHOS 78 04/15/2021 10:55 AM    AST 24 01/09/2023 03:35 PM    AST 42 (H) 04/15/2021 10:55 AM    ALT 14 01/09/2023 03:35 PM    ALT 46 (H) 04/15/2021 10:55 AM    GGT <7 12/16/2020 02:17 PM    ALBUMIN 4.2 01/09/2023 03:35 PM    INR 0.98 01/09/2023 03:34 PM     Lab Results   Component Value Date/Time    A1C 4.9 01/09/2023 03:35 PM     Lab Results   Component Value Date/Time    VITDTOTAL 27.3 01/09/2023 03:35 PM     No components found for: VITE  No results found for: VITA  Lab Results   Component Value Date/Time    IGE 522 (H) 01/09/2023 03:35 PM    IGE 636 (H) 12/29/2021 03:44 PM  IGE 1,041 (H) 12/16/2020 02:17 PM     Lab Results   Component Value Date/Time    CFSP <1+ Staphylococcus aureus (A) 01/09/2023 03:04 PM    CFSP 3+ Oropharyngeal Flora Isolated 01/09/2023 03:04 PM     No results found for: LABGRAM  Lab Results   Component Value Date/Time    [facility]  07/06/2022 04:20 PM     NO ACID FAST BACILLI SEEN- 3 negative smears do not exclude pulmonary TB. If active pulmonary TB is suspected, continue airborne isolation until pulmonary disease is excluded by negative cultures.       Imaging  No recent imaging       The patient reports they are physically located in [place] and is currently: at home. I conducted a audio/video visit. I spent  67m 16s on the video call with the patient. I spent an additional 5 minutes on pre- and post-visit activities on the date of service .     Portions of this record have been created using Scientist, clinical (histocompatibility and immunogenetics). Dictation errors have been sought, but may not have been identified and corrected.    Durenda Hurt  [place], [place]  03/14/23  10:10 AM

## 2023-03-14 NOTE — Unmapped (Unsigned)
Cystic Fibrosis Nutrition Assessment    Telehealth via video: MD Consult this visit related to cystic fibrosis protocol - annual assessment  Primary Pulmonary Provider: Karl Ito, PA  ===================================================================  Darlene Sutton is a 32 y.o. female seen for medical nutrition therapy related to Cystic Fibrosis.    Patient contacted remotely via telehealth visit: video  RD provided introduction, purpose of today's visit and confirmed patient identity.  Name/Relationship of other individuals with the patient in the room during the telehealth visit: n/a  Patient's physical location during visit [facility], State): [place], [place]  Patient's call back number: [phone]   I am located on-site and the patient is located off-site for this visit.    ===================================================================  INTERVENTION:    Continue nutrition regimen:  Vit D3 2000 units daily  Fat source with trikafta  Bowel regimen    Outpatient:  Time Spent (minutes): 6  Follow-up will occur per nutrition risk protocol for CF. Next follow-up occurs within one calendar year.  Above interventions and recommendations  will be assessed at time of follow-up.   ===================================================================  ASSESSMENT:  Nutrition Category = Adult CF, Outstanding, BMI >/= 22 kg/m2 for females    Estimated daily needs: 2197 kcal/day, 79-105 g PRO/day, 2418 mL fluid/day      Calories estimated using: Cystic Fibrosis Conference Formula, protein per DRI x 1.5-2, fluid per [facility]    Current diet is appropriate for CF. Current PO intake is adequate to meet estimated CF needs. Patient meeting goals for CF weight management. Enzyme replacement not necessary for this pancreatic sufficient patient. Bowel regimen is appropriate. Patient on CFTR modulator is consuming adequate amounts of fat-containing foods with prescribed medication to optimize absorption.    ASPEN/AND Malnutrition Screening:  Patient does not meet ASPEN/AND criteria for malnutrition at this time.    Nutrition Focused Physical Exam:   Assessment not indicated d/t lack of malnutrition risk factors. No loss noted upon visual examination.    Goals:  1. Met and Ongoing:  Meet estimated daily needs  2. Met and Ongoing:  Reach/maintain established anthropometric goals for Adult CF: BMI >/= 22 kg/m2 for females  3. Met and Ongoing:  Normal fat-soluble vitamin levels: Vitamin A, Vitamin E and PT per lab range; Vitamin D 25OH total >30   4. Met and Ongoing:  Maintain glucose control. Carbohydrate content of diet should comprise 40-50% of total calorie needs, but carbohydrates are not restricted in this population.    5. Met:  Meet sodium needs for CF     Nutrition goals reviewed, and relevant barriers identified and addressed: none evident.   Patient is evaluated to have good  willingness and ability to achieve nutrition goals.   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   10/10/22 24.77 kg/m??   07/06/22 25.26 kg/m??     Wt Readings from Last 3 Encounters:   01/09/23 70.8 kg (156 lb)   10/10/22 69.4 kg (153 lb)   07/06/22 70.8 kg (156 lb)     Ht Readings from Last 3 Encounters:  10/10/22 167.4 cm (5' 5.9)   07/06/22 167.4 cm (5' 5.9)   03/30/22 167.6 cm (5' 6)     ==================================================================  Energy Intake (outpatient):   Diet: Darlene Sutton endorses stable appetite and PO intake. Her intake was restrictive at last visit and she has since liberalized her dietary patterns significantly. She still avoids most red meat and alcohol. She spent much of the summer camping outside and was able to tolerate a wide variety of foods that she previously avoided (ie hot dogs, chicken nuggets). She typically eats 3 meals per day plus snacks.    Allergies, Intolerances, Sensitivities, and/or Cultural/Religious Dietary Restrictions:  No Known Allergies   Sodium in diet:  Likely adequate . Informed by GI to consume low sodium foods following pancreatitis episode but has since liberalized her diet and now eating normally.  Calcium in diet:  Adequate from dairy products 3-4 servings daily  Food Insecurity: not endorsed today  CFTR modulator and Diet: Prescribed Trikafta (elexacaftor/tezacaftor/ivacaftor). Adequate fat consumed with dose.    PO Supplements: none  Appetite Stimulant: none  Enteral feeding tube: none  Physical activity: walks or runs regularly    Fat Malabsorption (outpatient):  Enzyme brand, (meals/snacks): PERT not required; pancreatic sufficient  GI meds:  Nutritionally relevant medications reviewed. Metamucil, miralax  Stools (steatorrhea): not greasy, formed, once daily or every other day  Stools (constipation):  positive s/sx of constipation. Prescribed bowel regimen as above  GI symptoms:  none. Has not experienced any abdominal discomfort in several months.  Fecal Fat Studies: sufficient  Lab Results   Component Value Date    [id number] 12/28/2020     No results found for: ELAST  No results found for: PELAI    Vitamins/Minerals (outpatient):  CF-specific MVI, dose, compliance: none  Other vitamins: Vit D3 2000 units daily - taking consistently since labs were drawn in January  Calcium supplement: n/a  Fat-soluble vitamin levels: slightly low Vitamin D on last draw d/t inconsistent intake of Vit D supplement  Lab Results   Component Value Date    VITAMINA 34.5 01/09/2023    VITAMINA 36.2 12/29/2021    VITAMINA 36.4 12/16/2020     No results found for: CRP  Lab Results   Component Value Date    VITDTOTAL 27.3 01/09/2023    VITDTOTAL 23.1 12/29/2021    VITDTOTAL 32.6 12/16/2020     Lab Results   Component Value Date    VITAME 7.6 01/09/2023    VITAME 6.5 12/29/2021    VITAME 7.3 12/16/2020     Lab Results   Component Value Date    PT 11.0 01/09/2023    PT 12.2 12/29/2021    PT 11.7 12/16/2020     No results found for: DESGCARBPT  No results found for: PIVKAII    Bone Health: normal vitamin D level; adequate calcium intake from diet. Last DEXA (10/22) showed normal bone density for age. Repeat screening 2027.    CF Related Diabetes: no known hx of CFRD; HbA1c WNL   No results found for: GLUF  No results found for: GLUCOSE2HR  Lab Results   Component Value Date    A1C 4.9 01/09/2023    A1C 4.8 12/29/2021    A1C 5.0 12/16/2020       Jackqulyn Livings MPH, RD, LDN  Pager: [phone]

## 2023-03-16 DIAGNOSIS — J4540 Moderate persistent asthma, uncomplicated: Principal | ICD-10-CM

## 2023-03-16 MED ORDER — PREDNISONE 10 MG TABLET
ORAL_TABLET | Freq: Every day | ORAL | 0 refills | 5 days | Status: CP
Start: 2023-03-16 — End: 2023-03-21

## 2023-03-16 NOTE — Unmapped (Signed)
Corresponded with Darlene Sutton over MyChart yesterday and today. Her cough and sputum had worsened, and her ears were having pressure and popping. Advised her to go ahead and start Augmentin and sent Prednisone 30 mg daily x 5 days. Spoke to her by phone this afternoon, and she reports already feeling much better. She is leaving for a trip to [facility] tomorrow.     Plan:   - Complete Augmentin and Prednisone  - Continue Hypertonic Saline + Aerobika daily  - Continue Albuterol daily and every 6 hrs PRN  - Continue Advair 2 puffs twice daily with spacer  - Continue Xyzal, Flonase, and Astelin nasal sprays  - Continue sinus rinses  - Patient has upcoming appt with [facility]   - Advised her to send me an update on her condition once she returns from [place] in a week, she agreed    Karl Ito, [place]  [facility]   [phone]

## 2023-03-19 DIAGNOSIS — J302 Other seasonal allergic rhinitis: Principal | ICD-10-CM

## 2023-03-19 MED ORDER — LEVOCETIRIZINE 5 MG TABLET
ORAL_TABLET | Freq: Every evening | ORAL | 3 refills | 90 days | Status: CP
Start: 2023-03-19 — End: ?

## 2023-03-28 NOTE — Unmapped (Signed)
[facility]    Home Spirometry Monitoring      03/28/23        I have reviewed 1 home spirometry test(s) over prior calendar month.  Most recent test is normal.  Compared to prior home spirometry values, the most recent test is worse. Best in-clinic FEV1 in last year was 3.96 L (122%) on 01/09/23.         Quality of most recent test graded A.     Follow-up plan:   Patient recently contacted me over MyChart due to worsened allergies and increased cough/sputum production. She was prescribed Augmentin, Prednisone, and Advair, and she was referred to [facility].   Continue monthly home spirometry monitoring for the management of cystic fibrosis.    Karl Ito, [place]  [facility]   [phone]

## 2023-04-02 NOTE — Unmapped (Signed)
[facility] Refill Coordination Note    Darlene Sutton, DOB: 01-05-1991  Phone: [phone] (home)       All above HIPAA information was verified with patient.         04/02/2023     6:34 AM   Specialty Rx Medication Refill Questionnaire   Which Medications would you like refilled and shipped? Trikafta , albuterol Inhaler   Please list all current allergies: Na   Have you missed any doses in the last 30 days? No   Have you had any changes to your medication(s) since your last refill? No   How many days remaining of each medication do you have at home? 1 week 2 days   Have you experienced any side effects in the last 30 days? No   Please enter the full address (street address, city, state, zip code) where you would like your medication(s) to be delivered to. [address] , [place] [address]   Please specify on which day you would like your medication(s) to arrive. Note: if you need your medication(s) within 3 days, please call the pharmacy to schedule your order at [phone]  04/05/2023   Has your insurance changed since your last refill? No   Would you like a pharmacist to call you to discuss your medication(s)? No   Do you require a signature for your package? (Note: if we are billing Medicare Part B or your order contains a controlled substance, we will require a signature) No         Completed refill call assessment today to schedule patient's medication shipment from the [facility] [phone]).  All relevant notes have been reviewed.       Confirmed patient received a Conservation officer, historic buildings and a Surveyor, mining with first shipment. The patient will receive a drug information handout for each medication shipped and additional FDA Medication Guides as required.         REFERRAL TO PHARMACIST     Referral to the pharmacist: Not needed      [facility]     Shipping address confirmed in Epic.     Delivery Scheduled: Yes, Expected medication delivery date: 04/05/23. Medication will be delivered via UPS to the prescription address in Epic WAM.    Amie Cowens' W [facility] Specialty Technician

## 2023-04-04 MED FILL — TRIKAFTA 100-50-75 MG (D)/150 MG (N) TABLETS: 28 days supply | Qty: 84 | Fill #2

## 2023-04-23 ENCOUNTER — Ambulatory Visit: Admit: 2023-04-23 | Discharge: 2023-04-24 | Payer: PRIVATE HEALTH INSURANCE

## 2023-04-23 DIAGNOSIS — J302 Other seasonal allergic rhinitis: Principal | ICD-10-CM

## 2023-04-23 DIAGNOSIS — J4540 Moderate persistent asthma, uncomplicated: Principal | ICD-10-CM

## 2023-04-23 MED ORDER — OLOPATADINE 0.6 % NASAL SPRAY
Freq: Two times a day (BID) | NASAL | 11 refills | 0 days | Status: CP
Start: 2023-04-23 — End: ?

## 2023-04-23 MED FILL — ALBUTEROL SULFATE 2.5 MG/3 ML (0.083 %) SOLUTION FOR NEBULIZATION: RESPIRATORY_TRACT | 22 days supply | Qty: 270 | Fill #0

## 2023-04-23 NOTE — Unmapped (Addendum)
[facility]  [address]  [place], [place] [address]              Your provider today was: Freda Jackson, MD, PhD    Thank you for letting us be involved with your medical care.    Contact Information:    Appointments and Referrals [phone] or [phone]   Refills, form requests, non-urgent questions: [phone] or [phone]  Please note that it may take up to 48 hours to return your call.   Emergencies (Nights and Weekends): [phone]) [phone]  Ask for the Allergy/Immunology Doctor on call       You can also use [email or web address] ([email or web address] to request medication refills, access test results, and send questions to your doctor.    I recommend the following medications to help with your nasal symptoms:     Flonase nasal spray, 2 sprays per nostril once per day or one spray twice per day -- need to take this regularly to see benefit  Patanase (olopatadine) 2 sprays per nostril twice per day -- can take this as needed  Cetirizine (Zyrtec) or Fexofenadine (Allegra) or Xyzal once daily. You can try this medication on a regular or as needed basis to see if it helps with your symptoms. If symptoms are worse, you can increase to 2 doses per day.    We discussed allergy shots. If you would like to consider these, please reach out so that we can schedule you for skin testing. Before skin testing, please stop all antihistamines for 5 days (including Xyzal and Patanase nasal spray).         Allergen Avoidance Measures  Allergen Recommendations for reducing exposure   Animal dander (cats, dogs) Remove animal from house, or at minimum, keep animal out of patient's bedroom. Keep pet in a room with a HEPA filter and replace the filter as recommended by the manufacturer.   Cover air ducts that lead to bedroom with filters. Replace filters as recommended by the manufacturer.  Use air filters and vacuums with HEPA filters. Replace the filter as recommended by the manufacturer.    Dust mites  Encase mattress, pillows, and boxspring in allergen-impermeable covers. Finely woven covers for pillows and duvets are preferable.   Wash bedding weekly in warm water with detergent or use electric dryer on hot setting.   Remove stuffed toys from the sleeping area  Reduce indoor humidity to <50 percent. Avoid use of humidifiers.  Consider removing carpets from the bedroom. Replace old upholstered furniture with leather, vinyl, or wood.   Pollens (tree, grass, weeds), outdoor molds Keep home and car windows closed during pollen seasons. Use air conditioning.  Shower or bathe before bedtime to wash away any pollen   Cockroaches Use poison bait or traps to control. Consult professional exterminator for severe infestation.   Periodically clean home thoroughly. Encase all food fully and do not store garbage or papers inside the home.   Fix water leaks.   Indoor molds Clean moldy surfaces with dilute bleach solution.   Fix water leaks.   Reduce indoor humidity to <50 percent. Avoid use of humidifiers. Evaporative (or swamp) coolers should be avoided or cleaned regularly.

## 2023-04-23 NOTE — Unmapped (Signed)
[facility]   New Patient     Referring Physician:    Karl Ito, PA  [address]  [address]  [place],  [place] [address]    Reason for Referral: Allergic rhinitis    Assessment and Plan:   Diagnoses:  1. Cystic fibrosis (CMS-HCC)    2. Moderate persistent asthma, unspecified whether complicated    3. Seasonal allergies        Assessment and Plan:  Darlene Sutton is a 32 y.o. female with a past medical history of cystic fibrosis on Trikafta seen in consultation at the request of Dr. Karl Ito for further evaluation of allergic rhinitis.     Allergic rhinitis  The patient has previous testing showed sensitization to dust mite, cat, dog, grass, trees. She has symptoms that are worst in the spring. We discussed that the first line therapy is intranasal steroids followed by oral antihistamines. We discussed allergen avoidance and provided information on avoidance measures. Given the ongoing symptoms, we will start a nasal antihistamine, patanase, because she did not tolerate azelastine due to the taste. We had a discussion about allergen immunotherapy with allergy shots including the adverse reactions and the time required for shots. She does not wish to pursue this at this time. If she would like to move forward with allergy shots at any time, she will come in for skin prick testing and then we can plan to move forward.    Follow-up:  Return if symptoms worsen or fail to improve.    No orders of the defined types were placed in this encounter.      I personally spent 45 minutes face-to-face and non-face-to-face in the care of this patient, which includes all pre, intra, and post visit time on the date of service.  All documented time was specific to the E/M visit and does not include any procedures that may have been performed.      --  Freda Jackson, MD PhD  Allergy & Immunology  [facility]      Subjective:   HPI:  I had the pleasure of seeing Darlene Sutton is a 32 y.o. female in the [facility] adult asthma clinic today.  The patient is a 32 y.o. female seen in consultation at the request of Dr. Karl Ito for further evaluation of allergic rhinitis. A thorough review of the available medical records was also performed.    She gets stuffy nose, post nasal drip, sore throat, itchy eyes. Spring is the worst time of year for her. She does have some symptoms in the fall. She developed these as a young child. She is taking Xyzal in the evenings and doing flonase 1 spray twice per day. When she is getting worse, she will use an albuterol inhaler or albuterol nebulizer. She does have Advair that she has used when she was having more symptoms. She has tried Azelastine but was limited by bad taste.    She has a history of recently diagnosed cystic fibrosis, that was diagnosed in the setting of pancreatitis. She had longstanding cough but no clear diagnosis until recently. She is now on Trikafta. She has an asthma diagnosis as a child but is now being treated for CF.     Triggers/contributing comorbid conditions include:   Rhinitis: As above  Sinusitis: Two years ago at dentist was told she has blocked sinus but no other symptoms.  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    Medications:     Current Outpatient Medications   Medication Sig Dispense Refill    albuterol 2.5 mg /3 mL (0.083 %) nebulizer solution Inhale 1 vial (2.5 mg total) by nebulization daily before hypertonic saline nebs. May also inhale 1 vial (2.5 mg total) every six (6) hours as needed for shortness of breath/wheezing. 270 mL 12    albuterol HFA 90 mcg/actuation inhaler Inhale 2 puffs daily prior to hypertonic saline nebs. May also inhale 2 puffs every six (6) hours as needed for shortness of breath/wheezing. 25.5 g 3    cholecalciferol, vitamin D3-50 mcg, 2,000 unit,, 50 mcg (2,000 unit) cap Take by mouth.      elexacaftor-tezacaftor-ivacaft (TRIKAFTA) 100-50-75 mg(d) /150 mg (n) tablet Take 2 Tablets (Elexacaftor 100mg /Tezacaftor 50mg /Ivacaftor 75mg ) by mouth in the morning and 1 tablet (ivacaftor 150mg ) in the evening with fatty food 252 tablet 3    fluticasone propion-salmeterol (ADVAIR HFA) 115-21 mcg/actuation inhaler Inhale 2 puffs two (2) times a day. Use with spacer and rinse mouth after use. (Patient not taking: Reported on 04/23/2023) 12 g 2    fluticasone propionate (FLONASE) 50 mcg/actuation nasal spray 1 spray into each nostril two (2) times a day. 16 g 11    levocetirizine (XYZAL) 5 MG tablet Take 1 Tablet by mouth every evening 90 tablet 3    methylcellulose (CITRUCEL, SUCROSE,) oral powder  (Patient not taking: Reported on 04/23/2023)      olopatadine 0.6 % Spry 2 sprays into each nostril two (2) times a day. 30.5 g 11    polyethylene glycol (GLYCOLAX) 17 gram/dose powder Take 17 g by mouth daily.      sodium chloride 3.5 % Nebu Inhale 1 vial (4ml) by nebulization once daily. 240 mL 5     No current facility-administered medications for this visit.       Allergies:   No Known Allergies    Family History:     Family History   Problem Relation Age of Onset    No Known Problems Mother     No Known Problems Father     No Known Problems Sister     No Known Problems Sister     No Known Problems Brother     Asthma Brother     Cystic fibrosis Paternal Uncle      Social History:     Social History     Socioeconomic History    Marital status: Single   Occupational History    Occupation: Runner, broadcasting/film/video / Therapist, sports: [employer]   Tobacco Use    Smoking status: Never    Smokeless tobacco: Never     Social Determinants of Health     Financial Resource Strain: Low Risk  (01/09/2023)    Overall Financial Resource Strain (CARDIA)  Difficulty of Paying Living Expenses: Not hard at all   Food Insecurity: No Food Insecurity (01/09/2023)    Hunger Vital Sign     Worried About Running Out of Food in the Last Year: Never true     Ran Out of Food in the Last Year: Never true   Transportation Needs: No Transportation Needs (01/09/2023)    PRAPARE - Therapist, art (Medical): No     Lack of Transportation (Non-Medical): No     Environmental History - See HPI    Objective:   PE:   Objective   Vitals:    04/23/23 1404   BP: 111/70   Pulse: 74       General:  Well nourished and in no acute distress without toxic appearance.  Skin:  No eczematous patches or rash. No dermatographism.  HEENT:  No conjunctival injection, Anterior nasal examination: edematous nasal mucosa, no nasal septal deviation or visible polyps; granular oropharynx without exudates, ulcerations, or thrush; no frontal or maxillary sinus tenderness  Neck:  Supple with full range of motion.   CV: Regular rhythm; normal S1 and S2; no murmur, gallop or rub.  Respiratory:  Adequate inspiratory effort. Clear to auscultation bilaterally. No wheezes, crackles, or rhonchi.  Gastrointestinal:  Soft, nontender, nondistended  Hematologic/Lymphatics: No cervical, supraclavicular, submandibular, or preauricular lymphadenopathy. No abnormal bruising.  Neurologic:  Alert and mental status appropriate for age; normal gait.  Musculoskeletal:  No peripheral edema. Normal bulk for age  Psychiatric: Relaxed, friendly, and cooperative with interview and examination.        Diagnostic Review:   Allergy testing reviewed and pertinent for the following:   Component      Latest Ref Rng 12/16/2020 04/15/2021 12/29/2021 01/09/2023   Class Description  Comment      D001-IgE D pteronyssinus      Class II kU/L  0.73 !      D002-IgE D farinae Mite      Class I kU/L  0.52 !      E001-IgE Cat Hair/Dander,Stan      Class V kU/L  70.40 !      E005-IgE Dog Dander      Class VI kU/L  >100 !      G002-IgE [place] Grass      Class 0 kU/L  <0.10      G006-IgE Timothy      Class III kU/L  2.31 !      G010-IgE Johnson Grass      Class 0 kU/L  <0.10      G017-IgE Bahia Grass      Class 0 kU/L  <0.10      Cockroach, American      Class 0/I kU/L  0.29 !      M001-IgE Penicillium Chrysogen      Class 0 kU/L  <0.10      M002-IgE Cladosporium herbaru      Class 0 kU/L  <0.10      M003-IgE Aspergillus fumigatu      Class 0 kU/L  <0.10      M004-IgE Mucor racemosus      Class 0 kU/L  <0.10      M006-IgE Alternaria tenuis      Class 0 kU/L  <0.10      M010-IgE Stemphylium herbarum      Class 0 kU/L  <0.10      T003-IgE Common Silver Charletta Cousin      Class 0 kU/L  <  0.10      T007-IgE Oak, White      Class 0 kU/L  <0.10      T008-IgE Elm, American (White      Class I kU/L  0.42 !      T001-IgE Maple/Box Elder      Class I kU/L  0.50 !      T041-IgE Hickory, White      Class II kU/L  0.91 !      IgE Maple Leaf/Sycamore      Class 0 kU/L  <0.10      T070-IgE White Mulberry      Class 0 kU/L  <0.10      T211-IgE Sweet Gum      Class 0/I kU/L  0.28 !      T006-IgE Cedar, [place]      Class III kU/L  3.04 !      W001-IgE Ragweed, Short/Commo      Class 0/I kU/L  0.10 !      W006-IgE Mugwort      Class 0 kU/L  <0.10      W009-IgE Plantain, English      Class 0 kU/L  <0.10      W014-IgE Pigweed, Rough      Class 0 kU/L  <0.10      W018-IgE Sheep Sorrel(Dock)      Class 0 kU/L  <0.10      W020-IgE Nettle      Class 0/I kU/L  0.11 !      WBC      3.6 - 11.2 10*9/L  3.5  3.9  4.7    RBC      3.95 - 5.13 10*12/L  4.43  4.14  4.13    HGB      11.3 - 14.9 g/dL  81.1  91.4  78.2    HCT      34.0 - 44.0 %  39.6  37.1  37.3    MCV      77.6 - 95.7 fL  89  89.6  90.4 MCH      25.9 - 32.4 pg  31.2  30.8  31.3    MCHC      32.0 - 36.0 g/dL  95.6  21.3  08.6    RDW      12.2 - 15.2 %  11.7  12.7  13.2    Platelet      150 - 450 10*9/L  276  299  288    Neutrophils %      %  51  46.9  55.7    Lymphocytes %      %  35  41.0  34.7    Monocytes %      %  8  6.6  6.9    Eosinophils %      %  5  4.6  1.9    Basophils %      %  1  0.9  0.8    Absolute Neutrophils      1.8 - 7.8 10*9/L  1.8  1.8  2.6    Absolute Lymphocytes      1.1 - 3.6 10*9/L  1.2  1.6  1.6    Absolute Monocytes       0.3 - 0.8 10*9/L  0.3  0.3  0.3    Absolute Eosinophils      0.0 - 0.5 10*9/L  0.2  0.2  0.1    Absolute Basophils  0.0 - 0.1 10*9/L  0.0  0.0  0.0    Immature Granulocytes      Not Estab. %  0      Bands Absolute      0.0 - 0.1 x10E3/uL  0.0      MPV      6.8 - 10.7 fL   8.2  8.6    nRBC      <=4 /100 WBCs   0  0    IgE, Total      2-214 IU/mL IU/mL 1,041 (H)   636 (H)  522 (H)       Legend:  (H) High  ! Abnormal    Laboratory testing reviewed and pertinent for the following:   Lab Results   Component Value Date    EOSABS 0.1 01/09/2023    EOSABS 0.2 12/29/2021    EOSABS 0.2 04/15/2021     No components found for: IGELEVEL

## 2023-04-24 DIAGNOSIS — J302 Other seasonal allergic rhinitis: Principal | ICD-10-CM

## 2023-04-24 MED ORDER — FLUTICASONE PROPIONATE 50 MCG/ACTUATION NASAL SPRAY,SUSPENSION
Freq: Two times a day (BID) | NASAL | 11 refills | 60.00000 days | Status: CP
Start: 2023-04-24 — End: 2023-04-24

## 2023-05-01 NOTE — Unmapped (Signed)
[facility] Special[facility] Pharmacy Refill Coordination Note    Darlene Sutton, DOB: 07-11-1991  Phone: [phone] (home)       All above HIPAA information was verified with patient.         05/01/2023    10:20 AM   Specialty Rx Medication Refill Questionnaire   Which Medications would you like refilled and shipped? Trikafta   Please list all current allergies: Na   Have you missed any doses in the last 30 days? No   Have you had any changes to your medication(s) since your last refill? No   How many days remaining of each medication do you have at home? 2 weeks   Have you experienced any side effects in the last 30 days? No   Please enter the full address (street address, city, state, zip code) where you would like your medication(s) to be delivered to. [address] , [place] [address]   Please specify on which day you would like your medication(s) to arrive. Note: if you need your medication(s) within 3 days, please call the pharmacy to schedule your order at [phone]  05/08/2023   Has your insurance changed since your last refill? No   Would you like a pharmacist to call you to discuss your medication(s)? No   Do you require a signature for your package? (Note: if we are billing Medicare Part B or your order contains a controlled substance, we will require a signature) No         Completed refill call assessment today to schedule patient's medication shipment from the [facility] [phone]).  All relevant notes have been reviewed.       Confirmed patient received a Conservation officer, historic buildings and a Surveyor, mining with first shipment. The patient will receive a drug information handout for each medication shipped and additional FDA Medication Guides as required.         REFERRAL TO PHARMACIST     Referral to the pharmacist: Not needed      [facility]     Shipping address confirmed in Epic.     Delivery Scheduled: Yes, Expected medication delivery date: 05/08/23.     Medication will be delivered via UPS to the prescription address in Epic WAM.    Bernabe Dorce' W Danae Chen Shared [facility] Specialty Technician

## 2023-05-07 MED FILL — TRIKAFTA 100-50-75 MG (D)/150 MG (N) TABLETS: 28 days supply | Qty: 84 | Fill #3

## 2023-05-31 NOTE — Unmapped (Signed)
[facility] Refill Coordination Note    Yesena Greenstein, DOB: 01/26/1991  Phone: [phone] (home)       All above HIPAA information was verified with patient.         05/30/2023     5:14 PM   Specialty Rx Medication Refill Questionnaire   Which Medications would you like refilled and shipped? Trikafta   Please list all current allergies: Na   Have you missed any doses in the last 30 days? No   Have you had any changes to your medication(s) since your last refill? No   How many days remaining of each medication do you have at home? 2 weeks   Have you experienced any side effects in the last 30 days? No   Please enter the full address (street address, city, state, zip code) where you would like your medication(s) to be delivered to. [address] , [place] , [address]   Please specify on which day you would like your medication(s) to arrive. Note: if you need your medication(s) within 3 days, please call the pharmacy to schedule your order at [phone]  06/05/2023   Has your insurance changed since your last refill? No   Would you like a pharmacist to call you to discuss your medication(s)? No   Do you require a signature for your package? (Note: if we are billing Medicare Part B or your order contains a controlled substance, we will require a signature) No         Completed refill call assessment today to schedule patient's medication shipment from the [facility] [phone]).  All relevant notes have been reviewed.       Confirmed patient received a Conservation officer, historic buildings and a Surveyor, mining with first shipment. The patient will receive a drug information handout for each medication shipped and additional FDA Medication Guides as required.         REFERRAL TO PHARMACIST     Referral to the pharmacist: Not needed      [facility]     Shipping address confirmed in Epic.     Delivery Scheduled: Yes, Expected medication delivery date: 06/05/23.     Medication will be delivered via UPS to the prescription address in Epic WAM.    Norie Latendresse' W Danae Chen Shared [facility] Specialty Technician

## 2023-06-04 MED FILL — TRIKAFTA 100-50-75 MG (D)/150 MG (N) TABLETS: 28 days supply | Qty: 84 | Fill #4

## 2023-06-20 DIAGNOSIS — J4530 Mild persistent asthma, uncomplicated: Principal | ICD-10-CM

## 2023-06-20 MED ORDER — FLOVENT HFA 44 MCG/ACTUATION AEROSOL INHALER
3 refills | 0 days
Start: 2023-06-20 — End: ?

## 2023-06-29 NOTE — Unmapped (Signed)
[facility] Pharmacy Refill Coordination Note    Darlene Sutton, DOB: November 06, 1991  Phone: [phone] (home)       All above HIPAA information was verified with patient.         06/27/2023    10:45 AM   Specialty Rx Medication Refill Questionnaire   Which Medications would you like refilled and shipped? Trikafta   Please list all current allergies: Na   Have you missed any doses in the last 30 days? Yes   If Yes, please choose the number of missed doses below: 0-2   Have you had any changes to your medication(s) since your last refill? No   How many days remaining of each medication do you have at home? 12 days   Have you experienced any side effects in the last 30 days? No   Please enter the full address (street address, city, state, zip code) where you would like your medication(s) to be delivered to. [address] , [place] , [address]   Please specify on which day you would like your medication(s) to arrive. Note: if you need your medication(s) within 3 days, please call the pharmacy to schedule your order at [phone]  07/04/2023   Has your insurance changed since your last refill? No   Would you like a pharmacist to call you to discuss your medication(s)? No   Do you require a signature for your package? (Note: if we are billing Medicare Part B or your order contains a controlled substance, we will require a signature) No         Completed refill call assessment today to schedule patient's medication shipment from the [facility] [phone]).  All relevant notes have been reviewed.       Confirmed patient received a Conservation officer, historic buildings and a Surveyor, mining with first shipment. The patient will receive a drug information handout for each medication shipped and additional FDA Medication Guides as required.         REFERRAL TO PHARMACIST     Referral to the pharmacist: Not needed      [facility]     Shipping address confirmed in Epic.     Delivery Scheduled: Yes, Expected medication delivery date: 07/04/23.     Medication will be delivered via UPS to the prescription address in Epic WAM.    Darlene Sutton' W Darlene Sutton [facility] Specialty Technician

## 2023-07-03 MED FILL — TRIKAFTA 100-50-75 MG (D)/150 MG (N) TABLETS: 28 days supply | Qty: 84 | Fill #5

## 2023-07-30 NOTE — Unmapped (Signed)
[facility] Special[facility] Pharmacy Refill Coordination Note    Darlene Sutton, DOB: [birth year]  Phone: [phone] (home)       All above HIPAA information was verified with patient.         07/29/2023    11:41 AM   Specialty Rx Medication Refill Questionnaire   Which Medications would you like refilled and shipped? Trikafta   Please list all current allergies: Na   Have you missed any doses in the last 30 days? Yes   If Yes, how many doses have you missed ? 0-2   Have you had any changes to your medication(s) since your last refill? No   How many days remaining of each medication do you have at home? 1 week 5 days   Have you experienced any side effects in the last 30 days? No   Please enter the full address (street address, city, state, zip code) where you would like your medication(s) to be delivered to. [address] , [place] [address]   Please specify on which day you would like your medication(s) to arrive. Note: if you need your medication(s) within 3 days, please call the pharmacy to schedule your order at [phone]  08/06/2023   Has your insurance changed since your last refill? No   Would you like a pharmacist to call you to discuss your medication(s)? No   Do you require a signature for your package? (Note: if we are billing Medicare Part B or your order contains a controlled substance, we will require a signature) No   Additional Comments: Na         Completed refill call assessment today to schedule patient's medication shipment from the [facility] [phone]).  All relevant notes have been reviewed.       Confirmed patient received a Conservation officer, historic buildings and a Surveyor, mining with first shipment. The patient will receive a drug information handout for each medication shipped and additional FDA Medication Guides as required.         REFERRAL TO PHARMACIST     Referral to the pharmacist: Not needed      [facility]     Shipping address confirmed in Epic.     Delivery Scheduled: Yes, Expected medication delivery date: 08/07/23.     Medication will be delivered via UPS to the prescription address in Epic WAM.    Analeise Mccleery' W Danae Chen Shared [facility] Specialty Technician

## 2023-08-07 MED FILL — TRIKAFTA 100-50-75 MG (D)/150 MG (N) TABLETS: 28 days supply | Qty: 84 | Fill #6

## 2023-09-03 NOTE — Unmapped (Signed)
[facility] Pharm[facility] Refill Coordination Note    Darlene Sutton, DOB: [birth year]  Phone: [phone] (home)       All above HIPAA information was verified with patient.         09/03/2023     2:14 PM   Specialty Rx Medication Refill Questionnaire   Which Medications would you like refilled and shipped? Trikafta , Albuterol inhaler , Hyper-Sal   Please list all current allergies: Na   Have you missed any doses in the last 30 days? No   Have you had any changes to your medication(s) since your last refill? No   How many days remaining of each medication do you have at home? 1 week 5 days   Have you experienced any side effects in the last 30 days? No   Please enter the full address (street address, city, state, zip code) where you would like your medication(s) to be delivered to. [address] , [place] , [address]   Please specify on which day you would like your medication(s) to arrive. Note: if you need your medication(s) within 3 days, please call the pharmacy to schedule your order at [phone]  09/10/2023   Has your insurance changed since your last refill? No   Would you like a pharmacist to call you to discuss your medication(s)? No   Do you require a signature for your package? (Note: if we are billing Medicare Part B or your order contains a controlled substance, we will require a signature) No         Completed refill call assessment today to schedule patient's medication shipment from the [facility] [phone]).  All relevant notes have been reviewed.       Confirmed patient received a Conservation officer, historic buildings and a Surveyor, mining with first shipment. The patient will receive a drug information handout for each medication shipped and additional FDA Medication Guides as required.         REFERRAL TO PHARMACIST     Referral to the pharmacist: Not needed      [facility]     Shipping address confirmed in Epic.     Delivery Scheduled: Yes, Expected medication delivery date: 09/11/23.     Medication will be delivered via UPS to the prescription address in Epic WAM.    [name], PharmD   [facility] Specialty Pharmacist

## 2023-09-06 ENCOUNTER — Ambulatory Visit: Admit: 2023-09-06 | Discharge: 2023-09-06 | Payer: PRIVATE HEALTH INSURANCE

## 2023-09-06 ENCOUNTER — Ambulatory Visit: Admit: 2023-09-06 | Discharge: 2023-09-06 | Payer: PRIVATE HEALTH INSURANCE | Attending: Medical | Primary: Medical

## 2023-09-06 DIAGNOSIS — K5900 Constipation, unspecified: Principal | ICD-10-CM

## 2023-09-06 DIAGNOSIS — J4520 Mild intermittent asthma, uncomplicated: Principal | ICD-10-CM

## 2023-09-06 NOTE — Unmapped (Addendum)
Thank you for allowing [place] to be a part of your care.     Goals and plans we discussed today:  Great to see you  Resume Miralax every other day   Follow up in 3 months by video     To reach your CF nurse coordinators:  Patients with the last name A-K: Darlene Sutton [phone]  Patients with the last name L-Z: Darlene Sutton [phone]     For urgent issues after hours/weekends:  Hospital Operator: [phone]) [phone], ask for Pulmonary Fellow on-call     To make or change a clinic appointment:   [facility]: [phone]     MyChart Patient Guidelines:  To help you get the most out of your MyChart experience, the following guidelines have been developed. We thank you in advance for your cooperation.  MyChart messages are not read during nights, weekends, or holidays.   MyChart is to be utilized for non-urgent matters. It may take up to 3 business days for a response.  Please call and leave a message for your nurse for symptoms or urgent needs. Cystic Fibrosis Nurse Coordinators can be reached at the above numbers Monday-Friday 08:30 AM-4:30 PM  For urgent concerns after hours, weekends, and holidays: please page the on call pulmonary fellow by calling [phone]. If you do not receive a response within 60 minutes, please call back and have them paged again.  Cystic fibrosis clinics are on Tuesdays and Thursdays. There may be a delay in response during these days.  Please check with your pharmacy first for refills.  Form and letter requests may take up to 7-14 business days to complete depending on the nature of the request.     When you should call your nurse coordinator (NOT use MyChart) or call the pulmonary fellow on-call (if urgent and new)  Feeling sick or have a fever   Increase in cough   Change in amount of mucus or change mucus color/thickness  Coughing up blood or blood-tinged mucus  Chest pain  Shortness of breath   Lack of energy/Poor Appetite       Your experience with the CF team is important to [place]. We hope we have provided you with excellent service today. As a part of our commitment to high quality care, we are always looking for ways to improve. Please contact our clinic or nurse coordinators for any concerns/compliments. Please be aware that you may receive a survey from [facility] regarding your visit today. We also invite you to participate in the [facility]'s survey of our adult CF center with the below link. We encourage your participation and welcome your feedback.    Thank you for allowing [place] to participate in your care!    [facility]     [email or web address]    CF Experience of Care Survey  [email or web address]

## 2023-09-06 NOTE — Unmapped (Signed)
Adult Cystic Fibrosis Clinic Pharmacist Visit     Darlene Sutton is a 32 y.o. 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 Pertinent CF related problems include GI manifestations. Last clinic visit on 03/13/23 with Karl Ito PA-C. At that time, once daily neilmed sinus rinses were started.      OUTPATIENT CF RELATED MEDICATION REVIEW     Medications  Adherence/  Comments Labs as of 09/06/23   Assessment   Airway Clearance albuterol nebs daily and every 6 hours prn  albuterol HFA daily every 6 hours prn  HTS 3.5% daily   Using albuterol and hypertonic saline once a day every other day  Last FEV1:   01/09/23: 122%  09/06/23: 121.6% Normal function (FEV1 >90%).Marland Kitchen PFTs today are stable.   Chronic Respiratory/  Sinus/  Allergy Flonase 1 spray twice daily   Xyzal 5mg  once daily   Olopatadine 0.6% 2 sprays twice a day  Taking as prescribed. - Flonase and xyzal daily     Not taking olopatadine     CFTR Modulator Trikafta: 2 tablets (ELE/TEZ/IVA) QAM and 1 tablet (IVA) QPM   Taking as prescribed with fatty food.  Latest LFTs  Lab Results   Component Value Date    AST 24 01/09/2023    ALT 14 01/09/2023    ALKPHOS 46 01/09/2023    BILITOT 0.5 01/09/2023    BILIDIR 0.20 06/30/2021    On since Jan 2022, current schedule of LFT monitoring: annual  Last LFTs checked:   AST WNL.   ALT WNL  Tbili WNL  Up to date   Inhaled Antibiotics N/A   N/A Sputum culture/AFB  CF Sputum Culture   Date Value Ref Range Status   01/09/2023 <1+ Staphylococcus aureus (A)  Final     Comment:     Susceptibility Testing By Consultation Only   01/09/2023 3+ Oropharyngeal Flora Isolated  Final   07/06/2022 <1+ Staphylococcus aureus (A)  Final     Comment:     Susceptibility Testing By Consultation Only   07/06/2022 3+ Oropharyngeal Flora Isolated  Final   03/30/2022 <1+ Methicillin-Susceptible Staphylococcus aureus (A)  Final   03/30/2022 4+ Oropharyngeal Flora Isolated  Final     AFB Culture Date Value Ref Range Status   07/06/2022 No Acid Fast Bacilli Detected  Final   09/29/2021 No Acid Fast Bacilli Detected  Final   12/16/2020 No Acid Fast Bacilli Detected  Final    Exacerbations: Not frequent.   Last po antibiotics: March 2024 augmentin   Chronic ABX/  suppressive therapy   N/A N/A     Pancreatic Enzyme N/A - pancreatic sufficient        N/A      Vitamins Cholecalciferol 2,000 units daily  Taking as prescribed  Last vitamin levels  Lab Results   Component Value Date    VITAMINA 34.5 01/09/2023    VITDTOTAL 27.3 01/09/2023    VITAME 7.6 01/09/2023    PT 11.0 01/09/2023    INR 0.98 01/09/2023    Vitamin access:  OTC  Last vitamin Levels:   Vitamin A WNL.   Vitamin D abnormal, low.   Vitamin E WNL.   PT/INR WNL.  Up to date      Other GI Miralax 17g daily     Taking as needed       Endocrine No h/o CFRD  No h/o bone disease   N/A LAST OGTT:  No results found for: GLUF,  GLUCOSE2HR    Last DEXA: Z-Scores  Image Area 06/30/21   Spine 0.8   Femur N/A   Femoral Neck -0.9   Total Hip  0      Pancreatic sufficient  Last DEXA done 06/30/21 - WNL. Repeat due 2027     Neuro/  Psych N/A   N/A         Patient reported:  Side Effects reported: No   Insurance changes: No       MEDICATION MANAGEMENT   Adherence: Moderate (Minor variances to doses prescribed, misses occasional doses during the week)  Access: No issues related to access   Pharmacies used for CF medications:   [facility]    FOLLOW UP LAB(S)   Up to date      I spent a total of 10 minutes face to face with the patient delivering clinical care and providing education/counseling. Medications reviewed in EPIC medication station and updated today by the clinical pharmacist practitioner.     Recommendations and medication-related problems were discussed directly with pulmonologist, Durenda Hurt Rupcich, PA-C.    Electronically signed:  Alben Spittle, PharmD, BCACP, CPP  Clinical Pharmacist Practitioner  [facility] Adult [facility]ystic Fibrosis/Pulmonary Clinic  [phone]      I am located on-site and the patient is located on-site for this visit.

## 2023-09-06 NOTE — Unmapped (Signed)
Pulmonary Cystic Fibrosis Clinic Note    ASSESSMENT     Darlene Sutton is a 32 y.o. female with cystic fibrosis who presents for routine CF follow-up visit. Since her last video visit in March 2024, she has been doing well overall. She may have had a mild illness while traveling in July that resolved without intervention. Her FEV1 is stable at 122%.     Problem List Items Addressed This Visit          Respiratory    Cystic fibrosis (CMS-HCC) - Primary    Relevant Orders    CF Sputum/ CF Sinus Culture    Mild intermittent asthma       Other    Constipation           PLAN     1) Collected CF sputum culture via deep pharyngeal swab - unable to expectorate  2) Continue Albuterol, then Hypertonic Saline 3% + Aerobika for airway clearance - ok with every other day, increase as needed   4) Continue Trikafta - monitor LFT's yearly, last checked 12/2022   5) For asthma and allergies, continue Albuterol PRN, Xyzal/Flonase; she benefited from Advair in the Spring and will resume PRN in the future; she was seen by [facility] in 03/2023 and they discussed allergy shots, but decided to hold off; she will return if she wishes to pursue them   6) For her constipation, resume Miralax and Citrucel PRN - she can titrate the dose as needed for goal of 1 BM daily or every other day   7) She is established with a local PCP for women's health   8) Provided flu vaccine today; she will get her COVID booster locally; UTD on Tdap and pneumococcal vaccine   9) Patient is enrolled in the Remote CF Care research study and will follow up over video next visit     The patient will follow up in 3 months, or sooner if clinically indicated.    I personally spent 34 minutes face-to-face and non-face-to-face in the care of this patient, which includes all pre, intra, and post visit time on the date of service.  All documented time was specific to the E/M visit and does not include any procedures that may have been performed.    Karl Ito, [place]  [facility]   [phone]    SUBJECTIVE:        Darlene Sutton is a 32 y.o. year old female with cystic fibrosis who comes in today for a routine CF follow-up visit. Since her last video visit in 02/2023, she has been doing well overall. She developed mild cough and sputum production while traveling and staying in a hotel in July, but it resolved without any intervention. She denies cough or sputum production, chest pain, wheezing or shortness of breath. She denies sinus congestion or drainage. No recent allergy symptoms. She saw [facility] in April 2024 and they discussed allergy shots, but she wanted to hold off as it would require weekly visits. She has been taking Xyzal daily since the Spring. She has been off Advair. She uses Albuterol and Hypertonic Saline every other day. She is taking Trikafta consistently. She is walking every day for 1.5-2 miles at a time.     Since starting back work, she has noticed less frequent bowel movements. She has 1 BM twice per week but has been off Miralax. She plans to resume. She has some very mild bloating, but denies abdominal pain, nausea, vomiting,  diarrhea or heartburn.     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 She went to [place] twice over the Summer. She is going on an [place] cruise with her sister next month.     CF Overview:     Genetics: F508del / A455E   CF Modulator Tx: Trikafta - started late January 2022    Background: Diagnosed with CF after being admitted to [facility] for a first time episode of acute pancreatitis in 07/2020 and her CF genetic panel returned with 2 CF mutations. She had a CT Chest that showed evidence of bronchiectasis. She established care with our clinic in 11/2020 and afterwards started Trikafta.     Airway Clearance Regimen: Waymon Budge       Exercise Habits: Walking/running     Typical bacteria: MSSA     Last CF sputum culture: 01/09/23   Last AFB culture: 07/06/22     Inhaled ABX: None     Last oral antibiotics: Augmentin (02/2023)   Last IV antibiotics: Never     The patient judges that exacerbations requiring any antibiotic intervention (oral, inhaled, IV) are occurring about <1 times per year.    Hemoptysis: no  ABPA:  no  Ptx:  no  O2 needs:      no  Port:  no  Sinus symptoms (congestion, rhinorrhea, pain) are not present and this is felt to be the same compared to last visit.     Panc Insuf: No (fecal elastase normal at 378 on 12/28/20)   PEG:  no  Supplements: no  DIOS:  no  CF Liver Dz:   no  Patient is having approximately 1 stool twice per week.  Stools are described as formed in appearance.     Last Colonoscopy: Due at age 77 per CFF guidelines    Diabetes: no  Last OGTT: none; (Fasting n/a; 2 hour n/a)   HgbA1C: 01/09/23 (4.9%)    Osteopenia: no   Last DEXA: 09/29/21    Depression: no  Anxiety:  no - 1x episode of anxiety in Summer 2023 while on outward bound trip   Adherence to medications and airway clearance is rated as Excellent.      Medications:  Reviewed with patient and updated in EPIC.  Outpatient Encounter Medications as of 09/06/2023   Medication Sig Dispense Refill    albuterol 2.5 mg /3 mL (0.083 %) nebulizer solution Inhale 1 vial (2.5 mg total) by nebulization daily before hypertonic saline nebs. May also inhale 1 vial (2.5 mg total) every six (6) hours as needed for shortness of breath/wheezing. 270 mL 12    albuterol HFA 90 mcg/actuation inhaler Inhale 2 puffs daily prior to hypertonic saline nebs. May also inhale 2 puffs every six (6) hours as needed for shortness of breath/wheezing. 25.5 g 3    cholecalciferol, vitamin D3-50 mcg, 2,000 unit,, 50 mcg (2,000 unit) cap Take by mouth.      elexacaftor-tezacaftor-ivacaft (TRIKAFTA) 100-50-75 mg(d) /150 mg (n) tablet Take 2 Tablets (Elexacaftor 100mg /Tezacaftor 50mg /Ivacaftor 75mg ) by mouth in the morning and 1 tablet (ivacaftor 150mg ) in the evening with fatty food 252 tablet 3    fluticasone propionate (FLONASE) 50 mcg/actuation nasal spray USE one SPRAY in each nostril TWICE DAILY 16 g 11    levocetirizine (XYZAL) 5 MG tablet Take 1 Tablet by mouth every evening 90 tablet 3    polyethylene glycol (GLYCOLAX) 17 gram/dose powder Take 17 g by  mouth daily.      sodium chloride 3.5 % Nebu Inhale 1 vial (4ml) by nebulization once daily. 240 mL 5    methylcellulose (CITRUCEL, SUCROSE,) oral powder  (Patient not taking: Reported on 04/23/2023)      olopatadine 0.6 % Spry 2 sprays into each nostril two (2) times a day. (Patient not taking: Reported on 09/06/2023) 30.5 g 11    [DISCONTINUED] fluticasone propion-salmeterol (ADVAIR HFA) 115-21 mcg/actuation inhaler Inhale 2 puffs two (2) times a day. Use with spacer and rinse mouth after use. (Patient not taking: Reported on 04/23/2023) 12 g 2     No facility-administered encounter medications on file as of 09/06/2023.       Allergies:  No Known Allergies    Past Medical History:  Past Medical History:   Diagnosis Date    Asthma     Cystic fibrosis (CMS-HCC) 2021    Diagnosed with CF (F508del/A455E) after bout of acute pancreatitis     Pancreatitis, acute        Past Surgical History:  No past surgical history on file.    Social History:  Social History     Socioeconomic History    Marital status: Single     Spouse name: None    Number of children: None    Years of education: None    Highest education level: None   Occupational History    Occupation: Runner, broadcasting/film/video / Therapist, sports: [employer]   Tobacco Use    Smoking status: Never    Smokeless tobacco: Never     Social Determinants of Health     Financial Resource Strain: Low Risk  (01/09/2023)    Overall Financial Resource Strain (CARDIA)     Difficulty of Paying Living Expenses: Not hard at all   Food Insecurity: No Food Insecurity (01/09/2023)    Hunger Vital Sign     Worried About Running Out of Food in the Last Year: Never true     Ran Out of Food in the Last Year: Never true   Transportation Needs: No Transportation Needs (01/09/2023)    PRAPARE - Therapist, art (Medical): No     Lack of Transportation (Non-Medical): No       Family History:  Family History   Problem Relation Age of Onset    No Known Problems Mother     No Known Problems Father     No Known Problems Sister     No Known Problems Sister     No Known Problems Brother     Asthma Brother     Cystic fibrosis Paternal Uncle        Review of Systems:  A 12 point review of systems was negative except for pertinent items noted in the HPI.        OBJECTIVE:   Objective   Physical exam:   Vitals:    09/06/23 1430   BP: 105/75   BP Site: L Arm   BP Position: Sitting   BP Cuff Size: Medium   Pulse: 87   Temp: 36.7 ??C (98 ??F)   TempSrc: Temporal   SpO2: 99%   Weight: 71.2 kg (157 lb)       Body mass index is 25.42 kg/m??.  Wt Readings from Last 3 Encounters:   09/06/23 71.2 kg (157 lb)   01/09/23 70.8 kg (156 lb)   10/10/22 69.4 kg (153 lb)  GEN: Cooperative female, sitting up on exam table, NAD  HEAD: Normocephalic, atraumatic  EYES: PERRLA, anicteric sclerae, conjuctiva clear  NOSE: Nares and mucosa normal with midline septum, no drainage or sinus tenderness.  OROPHARYNX: Pink and moist without erythema or exudate  NECK: Supple, trachea midline  LYMPH: No palpable lymphadenopathy   HEART/CV: RRR, S1, S2 nl, no MRG  LUNGS: CTA bilaterally, no crackles or wheezes, normal WOB on RA  ABD: NABS, soft, NT/ND, no rebound or guarding, no masses, no hepatomegaly noted   EXT: No cyanosis, clubbing, or edema  SKIN: No rashes or lesions noted  NEURO: No focal deficits noted  PSYCH: Awake, alert, and interactive. Mood and affect appropriate.     Pulmonary Function Testing Results:  09/06/23        Interpretation: Spirometry is normal and unchanged from last visit.     Performed on 12/06/20 at [facility].   Results per CareEverywhere note:   FVC-Pre L 4.01   FVC-Predicted Pre % 99   FVC-Post L 3.97   FVC-Predicted Post % 98 Pre FEV1/FVC % % 83   Post FEV1/FCV % % 85   FEV1-Pre L 3.32   FEV1-Predicted Pre % 97   FEV1-Post L 3.39   DLCO uncorrected ml/min/mmHg 25.04   DLCO [place]% % 104   DLCO corrected ml/min/mmHg 25.04   DLCO COR %Predicted % 104   DLVA Predicted % 108   TLC L 5.95   TLC % Predicted % 111   RV % Predicted % 140     I reviewed interval medical records.    CF Monitoring:     Pertinent Laboratory Data:    CBC  Lab Results   Component Value Date    WBC 4.7 01/09/2023    HGB 12.9 01/09/2023    HCT 37.3 01/09/2023    PLT 288 01/09/2023       ELECTROLYTES  Lab Results   Component Value Date    Sodium 142 01/09/2023     Lab Results   Component Value Date    Potassium 3.9 01/09/2023     Lab Results   Component Value Date    Chloride 107 01/09/2023     Lab Results   Component Value Date    CO2 24.5 01/09/2023     Lab Results   Component Value Date    BUN 9 01/09/2023     Lab Results   Component Value Date    Creatinine 0.65 01/09/2023     Lab Results   Component Value Date    Glucose 85 01/09/2023     Lab Results   Component Value Date    Calcium 9.4 01/09/2023     Lab Results   Component Value Date    Anion Gap 11 01/09/2023       GI  Lab Results   Component Value Date    AST 24 01/09/2023    AST 42 (H) 04/15/2021     Lab Results   Component Value Date    ALT 14 01/09/2023    ALT 46 (H) 04/15/2021     Lab Results   Component Value Date    Alkaline Phosphatase 46 01/09/2023    Alkaline Phosphatase 78 04/15/2021     Lab Results   Component Value Date    Total Protein 7.4 01/09/2023    Total Protein 7.2 04/15/2021     Lab Results   Component Value Date    Albumin 4.2 01/09/2023  Lab Results   Component Value Date    GGT <7 12/16/2020     Lab Results   Component Value Date    Total Bilirubin 0.5 01/09/2023    Total Bilirubin 0.3 04/15/2021       VITAMIN LEVELS / INR:    Lab Results   Component Value Date    Vitamin A 34.5 01/09/2023     No results found for: VITD2  No results found for: VITD3  Lab Results   Component Value Date    Vitamin D Total (25OH) 27.3 01/09/2023     Lab Results   Component Value Date    Vitamin E 7.6 01/09/2023       Lab Results   Component Value Date    PT 11.0 01/09/2023     Lab Results   Component Value Date    INR 0.98 01/09/2023       IGE  Lab Results   Component Value Date    IgE, Total 522 (H) 01/09/2023    IgE, Total 636 (H) 12/29/2021    IgE, Total 1,041 (H) 12/16/2020       SPUTUM CULTURES  CF Sputum Culture (no units)   Date Value   01/09/2023 <1+ Staphylococcus aureus (A)   01/09/2023 3+ Oropharyngeal Flora Isolated     AFB Smear (no units)   Date Value   07/06/2022     NO ACID FAST BACILLI SEEN- 3 negative smears do not exclude pulmonary TB. If active pulmonary TB is suspected, continue airborne isolation until pulmonary disease is excluded by negative cultures.   09/29/2021     NO ACID FAST BACILLI SEEN- 3 negative smears do not exclude pulmonary TB. If active pulmonary TB is suspected, continue airborne isolation until pulmonary disease is excluded by negative cultures.     AFB Culture (no units)   Date Value   07/06/2022 No Acid Fast Bacilli Detected   09/29/2021 No Acid Fast Bacilli Detected       Pertinent Imaging Data:  CT Chest (10/15/20): Completed at [facility].   Impression:   1. Areas of bronchiectasis, bronchial wall thickening and cystic change with upper lobe predominance, pattern of disease that could   be seen in the setting of cystic fibrosis. Atypical infection could have a similar appearance.   2. Tree-in-bud opacities in the superior segment of the RIGHT lower lobe and scattered small pulmonary nodules about the periphery of   the RIGHT upper lobe. Correlate with any respiratory symptoms. No consolidation.   3. Incidental imaging of upper abdominal contents shows indistinct boundaries of the pancreas perhaps related to prior pancreatitis. No   overt edema in the upper abdomen. Low-dose nature of the scan also limits detail in these areas, correlate with any ongoing abdominal symptoms.     MRCP (10/12/20): Completed at [facility]   Impression:   1. No specific pancreatic abnormality to determine etiology of pancreatitis. Subtle indistinct margin through the neck and head of   the pancreas. Pancreatic duct is not identified.   2. No secondary signs of autoimmune pancreatitis.   3. Normal common bile duct.  No cholelithiasis.   4. No evidence of acute pancreatic inflammation.     CT Abd/Pelvis (07/29/20): Completed at [facility]  Impression:   1. Changes of acute interstitial edematous pancreatitis. No localized fluid collections and no ductal dilatation.   2. Small amount of free fluid in the pelvis could be physiologic or reactive related to the pancreatitis.  Other Health Maintenance:   Vaccines:   Immunization History   Administered Date(s) Administered    COVID-19 VAC,BIVALENT(60YR UP),PFIZER 09/29/2021    COVID-19 VACC,MRNA,(PFIZER)(PF) 02/21/2020, 03/16/2020, 10/15/2020    Covid-19 Vac, (75yr+) (Spikevax) Monovalent Moderna 10/16/2022    DTaP 12/28/1992, 02/17/1993, 04/28/1993, 05/01/1994, 10/27/1996    DTaP, Unspecified Formulation 12/28/1992, 02/17/1993, 04/28/1993, 05/01/1994, 10/27/1996    HPV Quadrivalent (Gardasil) 06/02/2009, 09/17/2009, 06/10/2010    HPV, Unspecified Formulation 06/10/2010    Hepatitis A Vaccine Pediatric / Adolescent 2 Dose IM 11/14/2006, 06/02/2009    Hepatitis B Vaccine, Unspecified Formulation 04-20-92, 12/28/1992, 07/28/1993    Hepatitis B vaccine, pediatric/adolescent dosage, 04/17/92, 12/28/1992, 07/28/1993    HiB, unspecified 12/28/1992, 02/17/1993, 04/28/1993, 02/02/1994    HiB-PRP-T 12/28/1992, 02/17/1993, 04/28/1993, 02/02/1994    INFLUENZA TIV (TRI) 64MO+ W/ PRESERV (IM) 10/05/2007, 09/17/2009    INFLUENZA VACCINE IIV3(IM)(PF)6 MOS UP 09/06/2023    Influenza LAIV (Nasal-Tri) HISTORICAL 11/14/2006    Influenza Vaccine PF(Quad)(Egg Free)18+(Flublok) 09/02/2020, 09/05/2021    Influenza Vaccine Quad(IM)6 MO-Adult(PF) 10/10/2022    Influenza Virus Vaccine, unspecified formulation 09/05/2020    MMR 02/02/1994, 10/27/1996    Meningococcal ACWY, Unspecified Formulation 06/10/2009    Meningococcal Conjugate MCV4P 06/10/2009    PPD Test 04/14/2014    Pneumococcal Conjugate 20-valent 01/09/2023    Polio Virus Vaccine, Unspecified Formulation 12/28/1992, 02/17/1993, 07/28/1993, 05/01/1994, 10/27/1996    Poliovirus,inactivated (IPV) 12/28/1992, 03/17/1993, 07/28/1993, 05/01/1994, 10/27/1996    TD(TDVAX),ADSORBED,2LF(IM)(PF) 02/17/2005    TdaP 06/10/2010, 09/10/2020    Varicella 11/06/1994, 11/14/2006

## 2023-09-07 NOTE — Unmapped (Signed)
CF sputum (OP swab) collected per order. Sample labeled in front of patient after confirming name and DOB. Sample tubed to the lab.    Influenza vaccine administered to L deltoid without difficulty. Patient tolerated well.     Shelba Flake Gentry Fitz, RN  CF Nurse Coordinator   [phone]

## 2023-09-11 MED FILL — ALBUTEROL SULFATE HFA 90 MCG/ACTUATION AEROSOL INHALER: 75 days supply | Qty: 25.5 | Fill #1

## 2023-09-11 MED FILL — HYPER-SAL 3.5 % SOLUTION FOR NEBULIZATION: RESPIRATORY_TRACT | 60 days supply | Qty: 240 | Fill #1

## 2023-09-11 MED FILL — TRIKAFTA 100-50-75 MG (D)/150 MG (N) TABLETS: 28 days supply | Qty: 84 | Fill #7

## 2023-09-17 NOTE — Unmapped (Signed)
[facility]    Home Spirometry Monitoring      09/17/23        I have reviewed 1 home spirometry test(s) over prior calendar month.  Most recent test demonstrates normal.  Compared to prior home spirometry values, the most recent test is unchanged. Best in-clinic FEV1 in last year was 3.96 L (122%) on 01/09/23.         Quality of most recent test graded A.     Follow-up plan:   Discussed results during our most recent clinic visit.   Continue monthly home spirometry monitoring for the management of cystic fibrosis.    Karl Ito, [place]  [facility]   [phone]

## 2023-09-27 NOTE — Unmapped (Signed)
[facility] Refill Coordination Note    Darlene Sutton, [place]: 07-01-1991  Phone: [phone] (home)       All above HIPAA information was verified with patient.         09/27/2023     6:38 AM   Specialty Rx Medication Refill Questionnaire   Which Medications would you like refilled and shipped? Trikafta   Please list all current allergies: Na   Have you missed any doses in the last 30 days? No   Have you had any changes to your medication(s) since your last refill? No   How many days remaining of each medication do you have at home? 2 weeks   Have you experienced any side effects in the last 30 days? No   Please enter the full address (street address, city, state, zip code) where you would like your medication(s) to be delivered to. [address] , [place] [address]   Please specify on which day you would like your medication(s) to arrive. Note: if you need your medication(s) within 3 days, please call the pharmacy to schedule your order at [phone]  10/03/2023   Has your insurance changed since your last refill? No   Would you like a pharmacist to call you to discuss your medication(s)? No   Do you require a signature for your package? (Note: if we are billing Medicare Part B or your order contains a controlled substance, we will require a signature) No         Completed refill call assessment today to schedule patient's medication shipment from the [facility] [phone]).  All relevant notes have been reviewed.       Confirmed patient received a Conservation officer, historic buildings and a Surveyor, mining with first shipment. The patient will receive a drug information handout for each medication shipped and additional FDA Medication Guides as required.         REFERRAL TO PHARMACIST     Referral to the pharmacist: Not needed      [facility]     Shipping address confirmed in Epic.     Delivery Scheduled: Yes, Expected medication delivery date: 10/03/23. Medication will be delivered via UPS to the prescription address in Epic WAM.    [name], PharmD   [facility] Specialty Pharmacist

## 2023-10-02 MED FILL — TRIKAFTA 100-50-75 MG (D)/150 MG (N) TABLETS: 28 days supply | Qty: 84 | Fill #8

## 2023-10-30 NOTE — Unmapped (Signed)
[facility] Pharm[facility] Refill Coordination Note    Darlene Sutton, Darlene Sutton: January 02, 1991  Phone: [phone] (home)       All above HIPAA information was verified with patient.         10/30/2023     8:21 AM   Specialty Rx Medication Refill Questionnaire   Which Medications would you like refilled and shipped? Trikafta   Please list all current allergies: Na   Have you missed any doses in the last 30 days? No   Have you had any changes to your medication(s) since your last refill? No   How many days remaining of each medication do you have at home? 10   Have you experienced any side effects in the last 30 days? No   Please enter the full address (street address, city, state, zip code) where you would like your medication(s) to be delivered to. [address] , [place] [address]   Please specify on which day you would like your medication(s) to arrive. Note: if you need your medication(s) within 3 days, please call the pharmacy to schedule your order at [phone]  11/01/2023   Has your insurance changed since your last refill? No   Would you like a pharmacist to call you to discuss your medication(s)? No   Do you require a signature for your package? (Note: if we are billing Medicare Part B or your order contains a controlled substance, we will require a signature) No         Completed refill call assessment today to schedule patient's medication shipment from the [facility] [phone]).  All relevant notes have been reviewed.       Confirmed patient received a Conservation officer, historic buildings and a Surveyor, mining with first shipment. The patient will receive a drug information handout for each medication shipped and additional FDA Medication Guides as required.         REFERRAL TO PHARMACIST     Referral to the pharmacist: Not needed      [facility]     Shipping address confirmed in Epic.     Delivery Scheduled: Yes, Expected medication delivery date: 11/01/23. Medication will be delivered via UPS to the prescription address in Epic WAM.    Darlene Sutton[facility] Specialty Technician

## 2023-11-01 MED FILL — TRIKAFTA 100-50-75 MG (D)/150 MG (N) TABLETS: 28 days supply | Qty: 84 | Fill #9

## 2023-11-01 NOTE — Unmapped (Signed)
Darlene Sutton 's Trikafta shipment will be delayed as a result of insufficient inventory of the drug.     I have spoken with the patient  at [phone]  and communicated the delivery change. We will reschedule the medication for the delivery date that the patient agreed upon.  We have confirmed the delivery date as 11/8, via ups.

## 2023-11-09 MED ORDER — TRIKAFTA 100-50-75 MG (D)/150 MG (N) TABLETS
ORAL_TABLET | 3 refills | 0 days | Status: CP
Start: 2023-11-09 — End: ?

## 2023-11-09 NOTE — Unmapped (Signed)
The [facility] has received the prescription(s) for Trikafta. The triage team has completed the benefits investigation and has determined that the patient is NOT able to fill this medication at the [facility] due to insurance plan limitations. Please see additional information below and re-route the prescription to the preferred pharmacy. Thank you.    PA Required: No (Approved until 12/05/23)    Specialty Pharmacy Required:  [facility] - Phone: [phone] and Fax: [phone]    Please note that due to Board of Pharmacy restrictions the authorizing provider must reorder to the pharmacy listed above due to insurance preference

## 2023-11-12 NOTE — Unmapped (Signed)
Specialty Medication(s): Trikafta     Darlene Sutton has been dis-enrolled from the [employer] [facility] specialty pharmacy services due to a pharmacy change resulting from insurance limitations. The insurance company requires the patient fill at [facility] .    Additional information provided to the patient: Left a voicemail informing of Trikafta pharmacy change and how she will need to request refills for other medicines through MyChart or telephone moving forward since she will be dis-enrolled from our specialty pharmacy services.     [name], PharmD  [facility] Specialty Pharmacist

## 2023-12-06 ENCOUNTER — Telehealth: Admit: 2023-12-06 | Discharge: 2023-12-07 | Payer: PRIVATE HEALTH INSURANCE | Attending: Medical | Primary: Medical

## 2023-12-11 DIAGNOSIS — K5900 Constipation, unspecified: Principal | ICD-10-CM

## 2024-03-27 ENCOUNTER — Ambulatory Visit: Admit: 2024-03-27 | Discharge: 2024-03-27

## 2024-03-27 ENCOUNTER — Ambulatory Visit: Admit: 2024-03-27 | Discharge: 2024-03-27 | Attending: Registered" | Primary: Registered"

## 2024-03-27 ENCOUNTER — Ambulatory Visit: Admit: 2024-03-27 | Discharge: 2024-03-27 | Attending: Medical | Primary: Medical

## 2024-03-27 ENCOUNTER — Ambulatory Visit
Admit: 2024-03-27 | Discharge: 2024-03-27 | Attending: Student in an Organized Health Care Education/Training Program | Primary: Student in an Organized Health Care Education/Training Program

## 2024-03-27 ENCOUNTER — Inpatient Hospital Stay: Admit: 2024-03-27 | Discharge: 2024-03-27

## 2024-03-27 DIAGNOSIS — Z23 Encounter for immunization: Principal | ICD-10-CM

## 2024-03-27 DIAGNOSIS — K5900 Constipation, unspecified: Principal | ICD-10-CM

## 2024-07-03 ENCOUNTER — Inpatient Hospital Stay: Admit: 2024-07-03 | Discharge: 2024-07-03 | Payer: PRIVATE HEALTH INSURANCE

## 2024-07-03 ENCOUNTER — Encounter: Admit: 2024-07-03 | Discharge: 2024-07-03 | Payer: PRIVATE HEALTH INSURANCE | Attending: Medical | Primary: Medical

## 2024-07-03 ENCOUNTER — Ambulatory Visit: Admit: 2024-07-03 | Discharge: 2024-07-03 | Payer: PRIVATE HEALTH INSURANCE | Attending: Medical | Primary: Medical

## 2024-07-03 ENCOUNTER — Ambulatory Visit: Admit: 2024-07-03 | Discharge: 2024-07-03 | Payer: PRIVATE HEALTH INSURANCE

## 2024-07-03 DIAGNOSIS — K5900 Constipation, unspecified: Principal | ICD-10-CM

## 2024-07-03 DIAGNOSIS — J4520 Mild intermittent asthma, uncomplicated: Principal | ICD-10-CM

## 2024-07-03 MED ORDER — ALBUTEROL SULFATE 2.5 MG/3 ML (0.083 %) SOLUTION FOR NEBULIZATION
Freq: Four times a day (QID) | RESPIRATORY_TRACT | 11 refills | 8.00000 days | Status: CP | PRN
Start: 2024-07-03 — End: 2025-07-03
  Filled 2024-09-08: qty 90, 8d supply, fill #0

## 2024-09-05 DIAGNOSIS — J302 Other seasonal allergic rhinitis: Principal | ICD-10-CM

## 2024-09-05 MED ORDER — LEVOCETIRIZINE 5 MG TABLET
ORAL_TABLET | Freq: Every evening | ORAL | 3 refills | 90.00000 days | Status: CP
Start: 2024-09-05 — End: ?

## 2024-09-05 MED ORDER — FLUTICASONE PROPIONATE 50 MCG/ACTUATION NASAL SPRAY,SUSPENSION
Freq: Two times a day (BID) | NASAL | 3 refills | 180.00000 days | Status: CP
Start: 2024-09-05 — End: 2025-09-05

## 2024-09-21 DIAGNOSIS — J302 Other seasonal allergic rhinitis: Principal | ICD-10-CM

## 2024-09-21 MED ORDER — LEVOCETIRIZINE 5 MG TABLET
ORAL_TABLET | Freq: Every evening | ORAL | 3 refills | 0.00000 days
Start: 2024-09-21 — End: ?

## 2024-09-22 MED ORDER — LEVOCETIRIZINE 5 MG TABLET
ORAL_TABLET | Freq: Every evening | ORAL | 3 refills | 90.00000 days | Status: CP
Start: 2024-09-22 — End: ?

## 2024-09-30 ENCOUNTER — Ambulatory Visit: Admit: 2024-09-30 | Discharge: 2024-09-30 | Payer: PRIVATE HEALTH INSURANCE | Attending: Medical | Primary: Medical

## 2024-09-30 ENCOUNTER — Inpatient Hospital Stay: Admit: 2024-09-30 | Discharge: 2024-09-30 | Payer: PRIVATE HEALTH INSURANCE

## 2024-09-30 MED ORDER — TRIKAFTA 100-50-75 MG (D)/150 MG (N) TABLETS
ORAL_TABLET | 3 refills | 0.00000 days
Start: 2024-09-30 — End: ?

## 2024-10-01 DIAGNOSIS — J302 Other seasonal allergic rhinitis: Principal | ICD-10-CM

## 2024-10-01 MED ORDER — FLUTICASONE PROPIONATE 50 MCG/ACTUATION NASAL SPRAY,SUSPENSION
Freq: Two times a day (BID) | NASAL | 3 refills | 180.00000 days | Status: CP
Start: 2024-10-01 — End: 2025-10-01

## 2024-10-01 MED ORDER — SODIUM CHLORIDE 3.5 % FOR NEBULIZATION
Freq: Every day | RESPIRATORY_TRACT | 5 refills | 60.00000 days | Status: CP
Start: 2024-10-01 — End: ?

## 2024-10-01 MED ORDER — ALBUTEROL SULFATE HFA 90 MCG/ACTUATION AEROSOL INHALER
RESPIRATORY_TRACT | 3 refills | 0.00000 days | Status: CP
Start: 2024-10-01 — End: ?

## 2024-10-01 MED ORDER — TRIKAFTA 100-50-75 MG (D)/150 MG (N) TABLETS
ORAL_TABLET | ORAL | 3 refills | 0.00000 days | Status: CP
Start: 2024-10-01 — End: ?

## 2025-01-06 ENCOUNTER — Ambulatory Visit: Admit: 2025-01-06 | Discharge: 2025-01-06 | Payer: PRIVATE HEALTH INSURANCE | Attending: Medical | Primary: Medical

## 2025-01-06 ENCOUNTER — Inpatient Hospital Stay: Admit: 2025-01-06 | Discharge: 2025-01-06 | Payer: PRIVATE HEALTH INSURANCE

## 2025-01-06 ENCOUNTER — Ambulatory Visit
Admit: 2025-01-06 | Discharge: 2025-01-06 | Payer: PRIVATE HEALTH INSURANCE | Attending: Registered" | Primary: Registered"

## 2025-01-06 ENCOUNTER — Ambulatory Visit: Admit: 2025-01-06 | Discharge: 2025-01-06 | Payer: PRIVATE HEALTH INSURANCE

## 2025-01-14 MED ORDER — MUPIROCIN 2 % TOPICAL OINTMENT
0 refills | 0.00000 days | Status: CP
Start: 2025-01-14 — End: ?

## 2025-01-14 MED ORDER — CHLORHEXIDINE GLUCONATE 0.12 % MOUTHWASH
1 refills | 0.00000 days | Status: CP
Start: 2025-01-14 — End: ?

## 2025-01-14 MED ORDER — SULFAMETHOXAZOLE 800 MG-TRIMETHOPRIM 160 MG TABLET
ORAL_TABLET | Freq: Two times a day (BID) | ORAL | 0 refills | 14.00000 days | Status: CP
Start: 2025-01-14 — End: 2025-01-28
